# Patient Record
Sex: Female | Born: 1985 | Race: White | Hispanic: No | Marital: Married
Health system: Southern US, Community
[De-identification: ages and names within clinical notes are randomized; demographics above are authoritative.]

## PROBLEM LIST (undated history)

## (undated) DIAGNOSIS — K2100 Gastro-esophageal reflux disease with esophagitis, without bleeding: Secondary | ICD-10-CM

## (undated) DIAGNOSIS — D509 Iron deficiency anemia, unspecified: Secondary | ICD-10-CM

## (undated) DIAGNOSIS — B2790 Infectious mononucleosis, unspecified without complication: Secondary | ICD-10-CM

## (undated) DIAGNOSIS — L709 Acne, unspecified: Secondary | ICD-10-CM

## (undated) DIAGNOSIS — N816 Rectocele: Secondary | ICD-10-CM

## (undated) DIAGNOSIS — M419 Scoliosis, unspecified: Secondary | ICD-10-CM

## (undated) DIAGNOSIS — O3480 Maternal care for other abnormalities of pelvic organs, unspecified trimester: Secondary | ICD-10-CM

## (undated) DIAGNOSIS — Z789 Other specified health status: Secondary | ICD-10-CM

## (undated) HISTORY — PX: TONSILLECTOMY: SUR1361

## (undated) HISTORY — DX: Acne, unspecified: L70.9

## (undated) HISTORY — DX: Iron deficiency anemia, unspecified: D50.9

## (undated) HISTORY — DX: Rectocele: O34.80

## (undated) HISTORY — DX: Other specified health status: Z78.9

## (undated) HISTORY — DX: Rectocele: N81.6

## (undated) HISTORY — PX: MYRINGOTOMY: SUR874

## (undated) HISTORY — PX: WISDOM TOOTH EXTRACTION: SHX21

## (undated) HISTORY — DX: Scoliosis, unspecified: M41.9

## (undated) HISTORY — DX: Infectious mononucleosis, unspecified without complication: B27.90

## (undated) HISTORY — DX: Gastro-esophageal reflux disease with esophagitis, without bleeding: K21.00

---
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
## 2006-03-17 ENCOUNTER — Ambulatory Visit: Payer: Self-pay | Admitting: Family Medicine

## 2006-04-29 ENCOUNTER — Ambulatory Visit: Payer: Self-pay | Admitting: Family Medicine

## 2006-05-05 ENCOUNTER — Ambulatory Visit: Payer: Self-pay | Admitting: Family Medicine

## 2006-05-19 ENCOUNTER — Ambulatory Visit: Payer: Self-pay | Admitting: Internal Medicine

## 2006-06-09 ENCOUNTER — Ambulatory Visit: Payer: Self-pay | Admitting: Family Medicine

## 2006-06-09 ENCOUNTER — Encounter: Payer: Self-pay | Admitting: Family Medicine

## 2006-06-09 ENCOUNTER — Other Ambulatory Visit: Admission: RE | Admit: 2006-06-09 | Discharge: 2006-06-09 | Payer: Self-pay | Admitting: Family Medicine

## 2006-09-11 ENCOUNTER — Encounter: Payer: Self-pay | Admitting: Family Medicine

## 2006-09-11 DIAGNOSIS — M928 Other specified juvenile osteochondrosis: Secondary | ICD-10-CM | POA: Insufficient documentation

## 2006-09-11 DIAGNOSIS — K589 Irritable bowel syndrome without diarrhea: Secondary | ICD-10-CM

## 2006-09-11 DIAGNOSIS — M4120 Other idiopathic scoliosis, site unspecified: Secondary | ICD-10-CM | POA: Insufficient documentation

## 2007-03-03 ENCOUNTER — Ambulatory Visit: Payer: Self-pay | Admitting: Family Medicine

## 2007-06-29 ENCOUNTER — Other Ambulatory Visit: Admission: RE | Admit: 2007-06-29 | Discharge: 2007-06-29 | Payer: Self-pay | Admitting: Family Medicine

## 2007-06-29 ENCOUNTER — Ambulatory Visit: Payer: Self-pay | Admitting: Family Medicine

## 2007-06-29 ENCOUNTER — Encounter: Payer: Self-pay | Admitting: Family Medicine

## 2007-07-02 ENCOUNTER — Encounter (INDEPENDENT_AMBULATORY_CARE_PROVIDER_SITE_OTHER): Payer: Self-pay | Admitting: *Deleted

## 2007-09-22 ENCOUNTER — Ambulatory Visit: Payer: Self-pay | Admitting: Family Medicine

## 2007-09-23 ENCOUNTER — Encounter: Admission: RE | Admit: 2007-09-23 | Discharge: 2007-09-23 | Payer: Self-pay | Admitting: Family Medicine

## 2007-10-26 ENCOUNTER — Telehealth: Payer: Self-pay | Admitting: Family Medicine

## 2008-03-31 ENCOUNTER — Ambulatory Visit: Payer: Self-pay | Admitting: Internal Medicine

## 2008-06-29 ENCOUNTER — Ambulatory Visit: Payer: Self-pay | Admitting: Family Medicine

## 2008-08-14 ENCOUNTER — Encounter: Payer: Self-pay | Admitting: Family Medicine

## 2008-08-14 ENCOUNTER — Ambulatory Visit: Payer: Self-pay | Admitting: Family Medicine

## 2008-08-14 ENCOUNTER — Other Ambulatory Visit: Admission: RE | Admit: 2008-08-14 | Discharge: 2008-08-14 | Payer: Self-pay | Admitting: Family Medicine

## 2008-08-14 DIAGNOSIS — J309 Allergic rhinitis, unspecified: Secondary | ICD-10-CM | POA: Insufficient documentation

## 2008-08-18 ENCOUNTER — Encounter (INDEPENDENT_AMBULATORY_CARE_PROVIDER_SITE_OTHER): Payer: Self-pay | Admitting: *Deleted

## 2009-05-02 DIAGNOSIS — B2790 Infectious mononucleosis, unspecified without complication: Secondary | ICD-10-CM

## 2009-05-02 HISTORY — DX: Infectious mononucleosis, unspecified without complication: B27.90

## 2009-05-14 ENCOUNTER — Ambulatory Visit: Payer: Self-pay | Admitting: Family Medicine

## 2009-05-16 ENCOUNTER — Emergency Department (HOSPITAL_COMMUNITY): Admission: EM | Admit: 2009-05-16 | Discharge: 2009-05-16 | Payer: Self-pay | Admitting: Emergency Medicine

## 2009-05-17 ENCOUNTER — Emergency Department (HOSPITAL_COMMUNITY): Admission: EM | Admit: 2009-05-17 | Discharge: 2009-05-18 | Payer: Self-pay | Admitting: Emergency Medicine

## 2009-05-24 ENCOUNTER — Ambulatory Visit: Payer: Self-pay | Admitting: Family Medicine

## 2009-05-24 DIAGNOSIS — E162 Hypoglycemia, unspecified: Secondary | ICD-10-CM

## 2009-06-14 ENCOUNTER — Ambulatory Visit: Payer: Self-pay | Admitting: Family Medicine

## 2009-06-14 LAB — CONVERTED CEMR LAB
Bilirubin Urine: NEGATIVE
Blood in Urine, dipstick: NEGATIVE
Ketones, urine, test strip: NEGATIVE
Specific Gravity, Urine: >=1.03
WBC Urine, dipstick: NEGATIVE
Yeast, UA: 0

## 2009-06-20 ENCOUNTER — Telehealth: Payer: Self-pay | Admitting: Family Medicine

## 2009-07-02 ENCOUNTER — Ambulatory Visit: Payer: Self-pay | Admitting: Family Medicine

## 2009-07-03 LAB — CONVERTED CEMR LAB
Albumin: 4 g/dL (ref 3.5–5.2)
BUN: 10 mg/dL (ref 6–23)
Basophils Absolute: 0 10*3/uL (ref 0.0–0.1)
Basophils Relative: 0.8 % (ref 0.0–3.0)
CO2: 29 meq/L (ref 19–32)
Chloride: 107 meq/L (ref 96–112)
Cholesterol: 140 mg/dL (ref 0–200)
Creatinine, Ser: 0.7 mg/dL (ref 0.4–1.2)
Eosinophils Absolute: 0.1 10*3/uL (ref 0.0–0.7)
GFR calc non Af Amer: 110.17 mL/min (ref >60)
HCT: 37.7 % (ref 36.0–46.0)
LDL Cholesterol: 82 mg/dL (ref 0–99)
MCV: 87.1 fL (ref 78.0–100.0)
Monocytes Absolute: 0.4 10*3/uL (ref 0.1–1.0)
Neutro Abs: 3.3 10*3/uL (ref 1.4–7.7)
Neutrophils Relative %: 60.1 % (ref 43.0–77.0)
Potassium: 4.2 meq/L (ref 3.5–5.1)
RBC: 4.32 M/uL (ref 3.87–5.11)
RDW: 12.5 % (ref 11.5–14.6)
Total Bilirubin: 0.3 mg/dL (ref 0.3–1.2)
Total Protein: 7 g/dL (ref 6.0–8.3)
Triglycerides: 38 mg/dL (ref 0.0–149.0)
VLDL: 7.6 mg/dL (ref 0.0–40.0)

## 2009-07-09 ENCOUNTER — Encounter: Payer: Self-pay | Admitting: Family Medicine

## 2009-08-15 ENCOUNTER — Encounter: Payer: Self-pay | Admitting: Family Medicine

## 2009-09-03 ENCOUNTER — Other Ambulatory Visit: Admission: RE | Admit: 2009-09-03 | Discharge: 2009-09-03 | Payer: Self-pay | Admitting: Family Medicine

## 2009-09-03 ENCOUNTER — Ambulatory Visit: Payer: Self-pay | Admitting: Family Medicine

## 2009-09-06 ENCOUNTER — Encounter (INDEPENDENT_AMBULATORY_CARE_PROVIDER_SITE_OTHER): Payer: Self-pay | Admitting: *Deleted

## 2009-09-06 LAB — CONVERTED CEMR LAB: Pap Smear: NEGATIVE

## 2009-11-26 ENCOUNTER — Telehealth: Payer: Self-pay | Admitting: Family Medicine

## 2010-05-25 ENCOUNTER — Encounter: Payer: Self-pay | Admitting: Family Medicine

## 2010-05-28 ENCOUNTER — Ambulatory Visit: Admit: 2010-05-28 | Payer: Self-pay | Admitting: Family Medicine

## 2010-05-31 ENCOUNTER — Ambulatory Visit: Admit: 2010-05-31 | Payer: Self-pay | Admitting: Family Medicine

## 2010-06-23 ENCOUNTER — Encounter: Payer: Self-pay | Admitting: Sports Medicine

## 2010-06-25 ENCOUNTER — Encounter: Payer: Self-pay | Admitting: Family Medicine

## 2010-07-04 NOTE — Assessment & Plan Note (Signed)
Summary: PAP ONLY / LFW   Vital Signs:  Patient profile:   25 year old female Height:      64 inches Weight:      140.75 pounds BMI:     24.25 Temp:     97.9 degrees F oral Pulse rate:   76 / minute Pulse rhythm:   regular BP sitting:   102 / 70  (left arm) Cuff size:   regular  Vitals Entered By: Lewanda Rife LPN (September 03, 4096 8:34 AM) CC: pap only LMP 08/28/09   History of Present Illness: here for annual gyn exam  is feeling good   on alesse  is working out well - really good periods are not heavy or painful and short 2-3 days   no need for STD tests  wants to stay on the same pill    Allergies: 1)  ! * Microgestin Fe 2)  ! * On 777 3)  ! 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 Eyes:  Denies blurring and eye irritation. GI:  Denies abdominal pain, bloody stools, and change in bowel habits. GU:  Denies abnormal vaginal bleeding, discharge, dysuria, genital sores, and urinary frequency. MS:  Denies low back pain. Derm:  Denies itching, lesion(s), poor wound healing, and rash. Neuro:  Denies headaches. Heme:  Denies abnormal bruising and bleeding.  Physical Exam  General:  Well-developed,well-nourished,in no acute distress; alert,appropriate and cooperative throughout examination Mouth:  pharynx pink and moist.   Neck:  No deformities,  masses, or tenderness noted. Chest Wall:  No deformities, masses, or tenderness noted. Breasts:  No mass, nodules, thickening, tenderness, bulging, retraction, inflamation, nipple discharge or skin changes noted.   Heart:  Normal rate and regular rhythm. S1 and S2 normal without gallop, murmur, click, rub or other extra sounds. Abdomen:  no suprapubic tenderness or fullness felt xyphoid process palpable and nt  Genitalia:  Normal introitus for age, no external lesions, no vaginal discharge, mucosa pink and moist, no vaginal or cervical lesions, no vaginal atrophy, no friaility or hemorrhage, normal uterus size and position, no adnexal masses or tenderness Skin:  Intact without suspicious lesions or rashes Cervical Nodes:  No lymphadenopathy noted Inguinal Nodes:  No significant adenopathy Psych:  normal affect, talkative and pleasant    Impression & Recommendations:  Problem # 1:  ROUTINE GYNECOLOGICAL EXAMINATION (ICD-V72.31) Assessment Comment Only annual exam with pap - no problems with her periods on alesse no need for std screen  will renew OC   Complete Medication List: 1)  Alesse  .Marland KitchenMarland Kitchen. 1 by mouth once daily as directed 2)  Multivitamins Tabs (Multiple vitamin) .... Take 1 tablet by mouth once a day 3)  Zyrtec Allergy 10 Mg Tabs (Cetirizine hcl) .... Otc as directed.  Patient Instructions: 1)  keep up good health habits  2)  no change in oral contraceptive  Prescriptions: ALESSE 1 by mouth once daily as directed  #1 pack  x 11   Entered and Authorized by:   Judith Part MD   Signed by:   Judith Part MD on 09/03/2009   Method used:   Print then Give to Patient   RxID:   [id number]   Current Allergies (reviewed today): ! * MICROGESTIN FE ! * ON 777 ! Fatima Blank

## 2010-07-04 NOTE — Letter (Signed)
Summary: [facility]  [facility]   Imported By: Lanelle Bal 06/07/2010 12:59:04  _____________________________________________________________________  External Attachment:    Type:   Image     Comment:   External Document

## 2010-07-04 NOTE — Progress Notes (Signed)
Summary: refill  Phone Note Refill Request Call back at Home Phone [phone] Message from:  Scriptline on November 26, 2009 9:40 AM  Refills Requested: Medication #1:  ALESSE 1 by mouth once daily as directed  3 month supply to [facility] [address]   Method Requested: Electronic Initial call taken by: Benny Lennert CMA Duncan Dull),  November 26, 2009 9:41 AM  Follow-up for Phone Call        px written on EMR for call in  Follow-up by: Judith Part MD,  November 26, 2009 10:11 AM  Additional Follow-up for Phone Call Additional follow up Details #1::        Medication phoned to pharmacy.  Additional Follow-up by: Delilah Shan CMA Duncan Dull),  November 26, 2009 12:23 PM    Prescriptions: ALESSE 1 by mouth once daily as directed  #3 packs x 3   Entered by:   Delilah Shan CMA (AAMA)   Authorized by:   Judith Part MD   Signed by:   Delilah Shan CMA (AAMA) on 11/26/2009   Method used:   Handwritten   RxID:   [id number]

## 2010-07-04 NOTE — Consult Note (Signed)
Summary: [facility]  [facility]   Imported By: Lanelle Bal 07/12/2009 10:38:03  _____________________________________________________________________  External Attachment:    Type:   Image     Comment:   External Document

## 2010-07-04 NOTE — Assessment & Plan Note (Signed)
Summary: CPX  W/PAP / LFW   Vital Signs:  Patient profile:   25 year old female Height:      64 inches Weight:      141 pounds BMI:     24.29 Temp:     97.9 degrees F oral Pulse rate:   76 / minute Pulse rhythm:   regular BP sitting:   110 / 68  (left arm) Cuff size:   regular  Vitals Entered By: Lowella Petties CMA (July 02, 2009 10:36 AM) CC: 30 minute check up   History of Present Illness: here for health mt exam is doing well overall   wt is down 2 lb with bmi 24-- good   bp great 110/68  pap 3/10-- is a little early for that- will return   Td 04 up to date  has not had flu shot - has never gotten one   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 * Microgestin Fe 2)  ! * On 777 3)  ! 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 Eyes:  Denies blurring and eye irritation. ENT:  Complains of  postnasal drainage; denies sinus pressure and sore throat. CV:  Denies chest pain or discomfort and palpitations. Resp:  Complains of cough and sputum productive; denies pleuritic, shortness of breath, and wheezing. GI:  Denies abdominal pain, change in bowel habits, and nausea. GU:  Denies abnormal vaginal bleeding, discharge, and dysuria. MS:  Denies joint pain. Derm:  Denies lesion(s), poor wound healing, and rash. Neuro:  Denies numbness and tingling. Psych:  Denies anxiety and depression. Endo:  Denies cold intolerance, excessive thirst, excessive urination, and heat intolerance. Heme:  Denies abnormal bruising and bleeding.  Physical Exam  General:  Well-developed,well-nourished,in no acute distress; alert,appropriate and cooperative throughout examination Head:  normocephalic, atraumatic, and no abnormalities observed.   Eyes:  vision grossly intact, pupils equal, pupils round, and pupils reactive to light.  no conjunctival pallor, injection or icterus  Ears:  R ear normal and L ear normal.   Nose:  no nasal discharge.   Mouth:  pharynx pink and moist.   Neck:  supple with full rom and no masses or thyromegally, no JVD or carotid bruit  Chest Wall:  No deformities, masses, or tenderness noted. Lungs:  Normal respiratory effort, chest expands symmetrically. Lungs are clear to auscultation, no crackles or wheezes. Heart:  Normal rate and  regular rhythm. S1 and S2 normal without gallop, murmur, click, rub or other extra sounds. Abdomen:  Bowel sounds positive,abdomen soft and non-tender without masses, organomegaly or hernias noted. Msk:  baseline scoliosis noted no acute joint changes  Pulses:  R and L carotid,radial,femoral,dorsalis pedis and posterior tibial pulses are full and equal bilaterally Extremities:  No clubbing, cyanosis, edema, or deformity noted with normal full range of motion of all joints.   Neurologic:  sensation intact to light touch, gait normal, and DTRs  symmetrical and normal.   Skin:  Intact without suspicious lesions or rashes few lentigos/ fair complexion Cervical Nodes:  No lymphadenopathy noted Inguinal Nodes:  No significant adenopathy Psych:  normal affect, talkative and pleasant    Impression & Recommendations:  Problem # 1:  HEALTH MAINTENANCE EXAM (ICD-V70.0) Assessment Comment Only reviewed health habits including diet, exercise and skin cancer prevention reviewed health maintenance list and family history lab today will f/u for pap/gyn exam in april when due-- will rev labs then  Orders: Venipuncture ([address]) TLB-Lipid Panel (80061-LIPID) TLB-BMP (Basic Metabolic Panel-BMET) (80048-METABOL) TLB-CBC Platelet - w/Differential (85025-CBCD) TLB-Hepatic/Liver Function Pnl (80076-HEPATIC) TLB-TSH (Thyroid Stimulating Hormone) (84443-TSH)  Complete Medication List: 1)  Alesse  .Marland KitchenMarland Kitchen. 1 by mouth once daily as directed 2)  Multivitamins Tabs (Multiple vitamin) .... Take 1 tablet by mouth once a day  Patient Instructions: 1)  labs today 2)  keep up the good diet and exercise and sleep habits  3)  schedule f/u in april for pap only (15 min visit is fine)  Prior Medications (reviewed today): ALESSE () 1 by mouth once daily as directed MULTIVITAMINS   TABS (MULTIPLE VITAMIN) Take 1 tablet by mouth once a day Current Allergies: ! * MICROGESTIN FE ! * ON 777 ! Lindsey Clements

## 2010-07-04 NOTE — Letter (Signed)
Summary: Results Follow up Letter  [place] at [facility]  [address] [place], [place] [address]   Phone: [phone]  Fax: [phone]    09/06/2009 MRN: [id number]    [facility] [address] [facility] [address] [place], [place]  [address]    Dear Ms. BURROUGHS,  The following are the results of your recent test(s):  Test         Result    Pap Smear:        Normal __X___  Not Normal _____ Comments:  Yearly follow up is recommended. ______________________________________________________ Cholesterol: LDL(Bad cholesterol):         Your goal is less than:         HDL (Good cholesterol):       Your goal is more than: Comments:  ______________________________________________________ Mammogram:        Normal _____  Not Normal _____ Comments:  ___________________________________________________________________ Hemoccult:        Normal _____  Not normal _______ Comments:    _____________________________________________________________________ Other Tests:    We routinely do not discuss normal results over the telephone.  If you desire a copy of the results, or you have any questions about this information we can discuss them at your next office visit.   Sincerely,    Marne A. Milinda Antis, M.D.  MAT:lsf

## 2010-07-04 NOTE — Progress Notes (Signed)
  Phone Note Call from Patient   Caller: Patient Summary of Call: Patient would like to be referred to [facility] ENT any recommendations as to who she should see? would like them to look at her throat, she is still coughing up like mucous and wants to see a specialist. The best day for her is a Monday but will take any appt. Call her [phone] Initial call taken by: Carlton Adam,  June 20, 2009 11:06 AM  Follow-up for Phone Call        ref to ent for sore throat and congestion I have no pref at [facility] -- all are fine  will route to [facility] Follow-up by: Judith Part MD,  June 20, 2009 11:08 AM  Additional Follow-up for Phone Call Additional follow up Details #1::        Appt made with Dr Jenne Pane at [facility] on 07/09/2009 at 2:15pm. Carlton Adam  June 20, 2009 2:51 PM  Additional Follow-up by: Carlton Adam,  June 20, 2009 2:51 PM  New Problems: SORE THROAT (ICD-462)   New Problems: SORE THROAT (ICD-462)

## 2010-07-04 NOTE — Assessment & Plan Note (Signed)
Summary: ?UTI/MK   Vital Signs:  Patient profile:   25 year old female Weight:      143 pounds Temp:     97.9 degrees F oral Pulse rate:   76 / minute Pulse rhythm:   regular BP sitting:   110 / 60  (left arm) Cuff size:   regular  Vitals Entered By: Lowella Petties CMA (June 14, 2009 10:41 AM) CC: Some burning with urination, itching.   History of Present Illness: some urinary symptoms since sunday at church  urinated a little -- and urge kept coming without much volume  next day some burning with urination -- now more itchy than anything else   urine smells funny   no fever or back pain  no nausea   has been drinking cranberry juice -- that helps a little   very uncomfortable now today a little itchy   nl vag d/c for her without changes   is not on menses no chance pregnant  Allergies: 1)  ! * Microgestin Fe 2)  ! * On 777 3)  ! 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 CV:  Denies chest pain or discomfort and palpitations. Resp:  Denies cough. GI:  Denies change in bowel habits, nausea, and vomiting. GU:  Complains of dysuria, nocturia, and urinary frequency. Derm:  Denies rash. Endo:  Denies excessive thirst.  Physical Exam  General:  Well-developed,well-nourished,in no acute distress; alert,appropriate and  cooperative throughout examination Head:  normocephalic, atraumatic, and no abnormalities observed.   Neck:  No deformities, masses, or tenderness noted. Lungs:  Normal respiratory effort, chest expands symmetrically. Lungs are clear to auscultation, no crackles or wheezes. Heart:  Normal rate and regular rhythm. S1 and S2 normal without gallop, murmur, click, rub or other extra sounds. Abdomen:  no suprapubic tenderness or fullness felt   Msk:  no CVA tenderness  Skin:  Intact without suspicious lesions or rashes Cervical Nodes:  No lymphadenopathy noted Inguinal Nodes:  No significant adenopathy Psych:  normal affect, talkative and pleasant    Impression & Recommendations:  Problem # 1:  UTI (ICD-599.0) Assessment New  with dysuria and frequency  ua neg dip but pos micro will tx with 5 d of cipro and update  enc water intake  pt advised to update me if symptoms worsen or do not improve - esp if any fever or back pain Her updated medication list for this problem includes:    Cipro 250 Mg Tabs (Ciprofloxacin hcl) .Marland Kitchen... 1 by mouth two times a day for 5 days  Orders: UA Dipstick W/ Micro (manual) ([address])  Complete Medication List: 1)  Alesse  .Marland KitchenMarland Kitchen. 1 by mouth once daily as directed 2)  Multivitamins Tabs (Multiple vitamin) .... Take 1 tablet by mouth once a day 3)  Cipro 250 Mg Tabs (Ciprofloxacin hcl) .Marland Kitchen.. 1 by mouth two times a day for 5 days  Patient Instructions: 1)  continue  drinking lots of water 2)  call or seek care is symptoms don't improve in 2-3 days or if you develop back pain, nausea, or vomiting  3)  take cipro as directed  Prescriptions: CIPRO 250 MG TABS (CIPROFLOXACIN HCL) 1 by mouth two times a day for 5 days  #10 x 0   Entered and Authorized by:   Judith Part MD   Signed by:   Judith Part MD on 06/14/2009   Method used:   Print then Give to Patient   RxID:   [id number]   Laboratory Results   Urine Tests  Date/Time Received: June 14, 2009 10:37 AM  Date/Time Reported: June 14, 2009 10:37 AM   Routine Urinalysis   Color: yellow Appearance: Clear Glucose: negative   (Normal Range: Negative) Bilirubin: negative   (Normal Range: Negative) Ketone: negative   (Normal Range: Negative) Spec. Gravity: >=1.030   (Normal Range: 1.003-1.035) Blood: negative   (Normal Range: Negative) pH: 5.0   (Normal Range: 5.0-8.0) Protein: negative   (Normal Range: Negative) Urobilinogen: 0.2   (Normal Range: 0-1) Nitrite: negative   (Normal Range: Negative) Leukocyte Esterace: negative   (Normal Range: Negative)  Urine Microscopic WBC/HPF: 5-8 RBC/HPF: 2-3 Bacteria/HPF: many Mucous/HPF: few Epithelial/HPF: 1-2 Crystals/HPF: 0 Casts/LPF: 0 Yeast/HPF: 0 Other: 0        Prior Medications (reviewed today): ALESSE () 1 by mouth once daily as directed MULTIVITAMINS   TABS (MULTIPLE VITAMIN) Take 1 tablet by mouth once a day CIPRO 250 MG TABS (CIPROFLOXACIN HCL) 1 by mouth two times a day for 5 days Current Allergies: ! * MICROGESTIN FE ! * ON 777 ! Lindsey Clements

## 2010-07-04 NOTE — Consult Note (Signed)
Summary: [facility]  [facility]   Imported By: Lanelle Bal 08/20/2009 10:53:20  _____________________________________________________________________  External Attachment:    Type:   Image     Comment:   External Document

## 2010-07-10 NOTE — Consult Note (Signed)
Summary: [facility]  [facility]   Imported By: Sherian Rein 07/03/2010 08:50:49  _____________________________________________________________________  External Attachment:    Type:   Image     Comment:   External Document

## 2010-07-11 ENCOUNTER — Encounter: Payer: Self-pay | Admitting: Family Medicine

## 2010-07-11 ENCOUNTER — Ambulatory Visit (INDEPENDENT_AMBULATORY_CARE_PROVIDER_SITE_OTHER): Payer: BC Managed Care – PPO | Admitting: Family Medicine

## 2010-07-11 DIAGNOSIS — J019 Acute sinusitis, unspecified: Secondary | ICD-10-CM | POA: Insufficient documentation

## 2010-07-11 DIAGNOSIS — J309 Allergic rhinitis, unspecified: Secondary | ICD-10-CM

## 2010-07-18 NOTE — Assessment & Plan Note (Signed)
Summary: NASAL CONGESTION/MK   Vital Signs:  Patient profile:   25 year old female Height:      64 inches Weight:      144.50 pounds BMI:     24.89 Temp:     98.7 degrees F oral Pulse rate:   76 / minute Pulse rhythm:   regular BP sitting:   92 / 60  (left arm) Cuff size:   regular  Vitals Entered By: Benny Lennert CMA Duncan Dull) (July 11, 2010 10:35 AM)  History of Present Illness: Chief complaint nasal congestion  25 year ld female with history of mono 1 year ago. Since then feels lump in throat and post nasal drip.  In winter .Marland Kitchen noted more drainage and sore throat....treated with amoxicillin and steroid shot. Felt better.. no further ST pain, but continued post nasal drip.  1/24.Marland Kitchen amoxicllin x 10 days.Marland Kitchenstarted by urgent care...made slight difference  Continued to have issues so went to her ENT  2 weeks ago went to ENT... told thought GERD causing mucus. Started on omprazole 40 mg daily..no help at all.  Per pt he did not do laryngoscopy.  Now in last week nasal congestion.. blowing out green discharge. Taking decongestant...using afrin daily x 1 day. No ear pain, mld soreness in neck No face pain, no fever.   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 1 By Mouth Once Daily As Directed 2)  Multivitamins   Tabs (Multiple Vitamin) .... Take 1 Tablet By Mouth Once A Day 3)  Zyrtec Allergy 10 Mg Tabs (Cetirizine Hcl) .... Otc As Directed.  Allergies: 1)  ! * Microgestin Fe 2)  ! * On 777 3)  ! * Yasmin  Past History:  Past medical, surgical, family and social histories (including risk factors) reviewed, and no changes noted (except as noted  below).  Past Medical History: Reviewed history from 05/24/2009 and no changes required. scoliosis mild acne mononucleosis 12/10  Past Surgical History: Reviewed history from 09/11/2006 and no changes required. Tonsillectomy Myringotomy tubes  Family History: Reviewed history from 05/24/2009 and no changes required. mother- thyroid problems and HTN  GF with alzheimer's GF with HTN and cholesterol GM with HTN  GM - DM   Social History: Reviewed history from 06/29/2008 and no changes required. 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 CV:  Denies chest pain or discomfort. Resp:  Denies shortness of breath. GI:  Denies abdominal pain. GU:  Denies dysuria.  Physical Exam  General:  Well-developed,well-nourished,in no acute distress; alert,appropriate and cooperative throughout examination Head:  ttp B ethmoid sinuses, no maxillary ttp  Ears:  clear fluid B TMs  Nose:  nasal dischargemucosal pallor.   Mouth:  oropharyngeal erythema mild, no exudate Neck:  B ant cervixal nodes tender Lungs:  Normal respiratory effort, chest expands symmetrically. Lungs are clear to auscultation, no crackles or wheezes. Heart:  Normal rate and regular rhythm. S1 and S2 normal without gallop, murmur, click, rub or other extra sounds. Pulses:  R and L carotid,radial,femoral,dorsalis pedis and posterior tibial pulses are full and equal bilaterally Skin:  Intact without  suspicious lesions or rashes   Impression & Recommendations:  Problem # 1:  SINUSITIS, ACUTE (ICD-461.9) Assessment New NAsal saline irrigation, broaden antibiotics given .. symptoms > 3 weeks.  Her updated medication list for this problem includes:    Amoxicillin-pot Clavulanate 400-57 Mg/90ml Susr (Amoxicillin-pot clavulanate) .Marland Kitchen... 2 tsp by mouth two times a day x 10 days    Fluticasone Propionate 50 Mcg/act  Susp (Fluticasone propionate) .Marland Kitchen... 2 sprays per nostril daily  Instructed on treatment. Call if symptoms persist or worsen.   Problem # 2:  ALLERGIC RHINITIS (ICD-477.9) Assessment: New Likely cause of chronic issues. Start nasal steroid and antihistamine.  Stop omeprazole given no improvement.  Her updated medication list for this problem includes:    Zyrtec Allergy 10 Mg Tabs (Cetirizine hcl) ..... Otc as directed.    Fluticasone Propionate 50 Mcg/act Susp (Fluticasone propionate) .Marland Kitchen... 2 sprays per nostril daily  Complete Medication List: 1)  Alesse  .Marland KitchenMarland Kitchen. 1 by mouth once daily as directed 2)  Multivitamins Tabs (Multiple vitamin) .... Take 1 tablet by mouth once a day 3)  Zyrtec Allergy 10 Mg Tabs (Cetirizine hcl) .... Otc as directed. 4)  Amoxicillin-pot Clavulanate 400-57 Mg/33ml Susr (Amoxicillin-pot clavulanate) .... 2 tsp by mouth two times a day x 10 days 5)  Fluticasone Propionate 50 Mcg/act Susp (Fluticasone propionate) .... 2 sprays per nostril daily  Patient Instructions: 1)  Take your antibiotic as prescribed until ALL of it is gone, but stop if you develop a rash or swelling and contact our office as soon as possible.  2)  Start nasal steroid, nasal saline spray as well. 3)  Start zyrtec at bedtime.  4)  Cal if not improving as expected.  Prescriptions: FLUTICASONE PROPIONATE 50 MCG/ACT SUSP (FLUTICASONE PROPIONATE) 2 sprays per nostril daily  #1 x 1   Entered and Authorized by:   Kerby Nora MD   Signed by:   Kerby Nora MD on 07/11/2010   Method used:   Electronically to        [facility] [id number] (retail[facility]       [address]       [place], [place]  [address]       Ph: [phone] or [phone]       Fax: [phone]   RxID:   [id number] AMOXICILLIN-POT CLAVULANATE 400-57 MG/5ML SUSR (AMOXICILLIN-POT CLAVULANATE) 2 tsp by mouth two times a day x 10 days  #200 x 0   Entered and Authorized by:   Kerby Nora MD   Signed by:   Kerby Nora MD on  07/11/2010   Method used:   [facility]        [facility] [id number]       [address]       [place], [place]  [address]       Ph: [phone] or [phone]       Fax: [phone]   RxID:   [id number]    Orders Added: 1)  Est. Patient Level IV [42706]    Current Allergies (reviewed today): ! * MICROGESTIN FE ! * ON 777 ! Fatima Blank

## 2010-09-02 LAB — CBC
MCHC: 34.6 g/dL (ref 30.0–36.0)
Platelets: 251 10*3/uL (ref 150–400)
RBC: 4.57 MIL/uL (ref 3.87–5.11)
RDW: 13.9 % (ref 11.5–15.5)

## 2010-09-02 LAB — DIFFERENTIAL
Basophils Relative: 1 % (ref 0–1)
Eosinophils Absolute: 0.1 10*3/uL (ref 0.0–0.7)

## 2010-09-02 LAB — MONONUCLEOSIS SCREEN: Mono Screen: POSITIVE — AB

## 2010-10-18 NOTE — Assessment & Plan Note (Signed)
[facility]                         GASTROENTEROLOGY OFFICE NOTE   Lindsey, Clements                   MRN:          [id number]  DATE:05/19/2006                            DOB:          13-Jun-1985    REFERRING PHYSICIAN:  Marne A. Tower, MD   REASON FOR CONSULTATION:  Constipation   ASSESSMENT:  Constipation.  Probably related to irritable bowel  problems.  CBC, CMET, TSH are normal.   RECOMMENDATIONS AND PLAN:  She has tried some MiraLax, but I do not  think she gave it a fair shot.  I will have her increase fiber, try a  fiber supplement, and take 4 doses of MiraLax in an hour to an hour and  a half one night, and repeat that to try to promote emptying of her  bowels, if needed.  After that, she should take MiraLax once a day.  Should she have persistent problems with that regimen, I can see her  back and we can consider flexible sigmoidoscopy, plus or minus barium  enema.  There are no other worrisome features at this time.  She has  changed  her diet and activity level over the past 6 months or so, and  that may have something to do with this.   HISTORY:  A pleasant 25 year old white woman who is in  cosmetology/beautician school.  She actually just finished that.  She  has also been working at [employer] and been eating a lot of fast food.  Sometime over the past several months, she has noticed that she goes a  week or a week and a half without significant defecation and gradually  gets more bloated and distended.  Then she empties her bowels multiple  times over 45 minutes or so.  There is no bleeding.  There is no rectal  pain.  There is no abdominal pain.  She saw Dr. Milinda Antis and was told to  increase fiber.  She tried MiraLax for about 4 days on the daily dose,  and seems to think it provided a little bit of relief, but nothing  significant she says.  She had a CBC, CMET, and TSH that were normal.   MEDICATIONS:  Birth control  pills once a day.   DRUG ALLERGIES:  NONE KNOWN.   PAST MEDICAL HISTORY:  Depression.   FAMILY HISTORY:  Diabetes in a grandmother.  No GI history.   SOCIAL HISTORY:  She lives with her grandparents.  She has just  graduated from beauty school, and was about to look for a job.  She is  single.  No alcohol, tobacco, or drugs.   REVIEW OF SYSTEMS:  See medical history form.   PHYSICAL EXAMINATION:  Physical exam reveals a well-developed young  white woman, height 5 feet 4 inches.  Weight 128 pounds.  Blood pressure  106/62.  Pulse 80 and regular.  She is in no acute distress.  The eyes are anicteric.  Heart S1, S2, no murmurs, rubs or gallops.  ABDOMEN:  Soft, nontender, no organomegaly or mass.  RECTAL EXAM:  In the presence of Lindsey Reading, RN, shows no  mass.  There  is some firm stool in the vault.  There is normal descent, normal tone,  no perianal changes.  SKIN:  No rash.  PSYCH:  She is alert and oriented times 3..   I appreciate the opportunity to care for this patient.     Iva Boop, MD,FACG  Electronically Signed    CEG/MedQ  DD: 05/19/2006  DT: 05/19/2006  Job #: [id number]   cc:   Marne A. Milinda Antis, MD

## 2010-11-19 ENCOUNTER — Encounter: Payer: Self-pay | Admitting: Family Medicine

## 2010-11-20 ENCOUNTER — Ambulatory Visit (INDEPENDENT_AMBULATORY_CARE_PROVIDER_SITE_OTHER): Payer: BC Managed Care – PPO | Admitting: Family Medicine

## 2010-11-20 ENCOUNTER — Encounter: Payer: Self-pay | Admitting: Family Medicine

## 2010-11-20 VITALS — BP 100/68 | HR 72 | Temp 97.6°F | Ht 64.0 in | Wt 134.8 lb

## 2010-11-20 DIAGNOSIS — N943 Premenstrual tension syndrome: Secondary | ICD-10-CM

## 2010-11-20 DIAGNOSIS — F3281 Premenstrual dysphoric disorder: Secondary | ICD-10-CM | POA: Insufficient documentation

## 2010-11-20 MED ORDER — DROSPIRENONE-ETHINYL ESTRADIOL 3-0.02 MG PO TABS: 1.0000 | ORAL_TABLET | Freq: Every day | ORAL | Status: DC

## 2010-11-20 NOTE — Patient Instructions (Signed)
Start yaz after this peroid  If any problems let me know  Don't smoke on the pill Schedule PE/ PAP this summer-any 30 min visit is fine

## 2010-11-20 NOTE — Progress Notes (Signed)
  Subjective:    Patient ID: Lindsey Clements, female    DOB: November 24, 1985, 24 y.o.   MRN: [id number]  HPI Has been on lessina for more than a year  Then stopped taking it for about 6 months (was not sexually active) Started back 2 mo prior to getting married  Notices worse PMS and worse headaches right before menses  Has 5-7 days of PMS - and then as soon as peroid starts is good / no problems   Has been on loestrin before -- got light headed   Got married - it all going well   Also sex drive is down as well  Stressed and very busy   Patient Active Problem List  Diagnoses  . HYPOGLYCEMIA, UNSPECIFIED  . IRRITABLE BOWEL SYNDROME  . OSGOOD SCHLATTER'S DISEASE  . SCOLIOSIS  . SINUSITIS, ACUTE  . ALLERGIC RHINITIS  . PMDD (premenstrual dysphoric disorder)   Past Medical History  Diagnosis Date  . Scoliosis   . Mild acne   . Mononucleosis 12/10   Past Surgical History  Procedure Date  . Tonsillectomy   . Myringotomy    History  Substance Use Topics  . Smoking status: Never Smoker   . Smokeless tobacco: Never Used  . Alcohol Use: No   Family History  Problem Relation Age of Onset  . Hypertension Mother   . Thyroid disease Mother   . Miscarriages / Stillbirths Neg Hx    No Known Allergies Current Outpatient Prescriptions on File Prior to Visit  Medication Sig Dispense Refill  . Multiple Vitamin (MULTIVITAMIN) tablet Take 1 tablet by mouth daily.        . cetirizine (ZYRTEC) 10 MG tablet Take OTC as directed as needed      . fluticasone (FLONASE) 50 MCG/ACT nasal spray 2 sprays daily as needed.            Review of Systems Review of Systems  Constitutional: Negative for fever, appetite change, and unexpected weight change. Pos for fatigue  Eyes: Negative for pain and visual disturbance.  Respiratory: Negative for cough and shortness of breath.   Cardiovascular: Negative.  for cp or palpitations  Gastrointestinal: Negative for nausea, diarrhea and  constipation.  Genitourinary: Negative for urgency and frequency.  Skin: Negative for pallor.  Neurological: Negative for weakness, light-headedness, numbness and headaches.  Hematological: Negative for adenopathy. Does not bruise/bleed easily.  Psychiatric/Behavioral: neg for SI , pos for moodiness with menses         Objective:   Physical Exam  Constitutional: She appears well-developed and well-nourished. No distress.  HENT:  Head: Normocephalic and atraumatic.  Mouth/Throat: Oropharynx is clear and moist.  Eyes: Conjunctivae are normal. Pupils are equal, round, and reactive to light.  Neck: Normal range of motion. Neck supple. No thyromegaly present.  Cardiovascular: Normal rate, regular rhythm, normal heart sounds and intact distal pulses.   Pulmonary/Chest: Effort normal and breath sounds normal. No respiratory distress. She has no wheezes.  Abdominal: Soft. Bowel sounds are normal.       No suprapubic tenderness    Musculoskeletal: She exhibits no edema.  Neurological: She is alert. She has normal reflexes.  Skin: Skin is warm and dry. No rash noted. No erythema. No pallor.  Psychiatric: She has a normal mood and affect. Judgment normal.          Assessment & Plan:

## 2010-11-20 NOTE — Assessment & Plan Note (Signed)
Including mood changes/ headaches/ malaise/ dec libido Disc hormonal eff on all these symptoms  Will change pill to yaz and update (warned not to smoke- which she does not) Urged to keep up healthy diet and exercise

## 2010-12-08 ENCOUNTER — Telehealth: Payer: Self-pay | Admitting: Family Medicine

## 2010-12-08 DIAGNOSIS — Z0000 Encounter for general adult medical examination without abnormal findings: Secondary | ICD-10-CM | POA: Insufficient documentation

## 2010-12-08 NOTE — Telephone Encounter (Signed)
Message copied by Judy Pimple on Sun Dec 08, 2010 12:15 PM ------      Message from: Baldomero Lamy      Created: Thu Dec 05, 2010 10:18 AM      Regarding: Cpx labs mon       Please order  future cpx labs for pt's upcomming lab appt.      Thanks      Rodney Booze

## 2010-12-09 ENCOUNTER — Other Ambulatory Visit: Payer: BC Managed Care – PPO

## 2010-12-10 ENCOUNTER — Other Ambulatory Visit (INDEPENDENT_AMBULATORY_CARE_PROVIDER_SITE_OTHER): Payer: BC Managed Care – PPO | Admitting: Family Medicine

## 2010-12-10 DIAGNOSIS — Z0000 Encounter for general adult medical examination without abnormal findings: Secondary | ICD-10-CM

## 2010-12-10 LAB — CBC WITH DIFFERENTIAL/PLATELET
Eosinophils Relative: 2.1 % (ref 0.0–5.0)
HCT: 37.5 % (ref 36.0–46.0)
Hemoglobin: 12.9 g/dL (ref 12.0–15.0)
MCHC: 34.5 g/dL (ref 30.0–36.0)
Monocytes Absolute: 0.2 10*3/uL (ref 0.1–1.0)
Monocytes Relative: 4.1 % (ref 3.0–12.0)
Neutrophils Relative %: 52.8 % (ref 43.0–77.0)
Platelets: 212 10*3/uL (ref 150.0–400.0)
RBC: 4.33 Mil/uL (ref 3.87–5.11)
WBC: 5.8 10*3/uL (ref 4.5–10.5)

## 2010-12-10 LAB — COMPREHENSIVE METABOLIC PANEL
ALT: 18 U/L (ref 0–35)
AST: 21 U/L (ref 0–37)
Albumin: 4.5 g/dL (ref 3.5–5.2)
Alkaline Phosphatase: 34 U/L — ABNORMAL LOW (ref 39–117)
BUN: 14 mg/dL (ref 6–23)
CO2: 26 mEq/L (ref 19–32)
Calcium: 9.3 mg/dL (ref 8.4–10.5)
GFR: 116.47 mL/min (ref >60.00)
Total Bilirubin: 0.6 mg/dL (ref 0.3–1.2)
Total Protein: 7.1 g/dL (ref 6.0–8.3)

## 2010-12-10 LAB — LIPID PANEL: Triglycerides: 124 mg/dL (ref 0.0–149.0)

## 2010-12-16 ENCOUNTER — Other Ambulatory Visit (HOSPITAL_COMMUNITY)
Admission: RE | Admit: 2010-12-16 | Discharge: 2010-12-16 | Disposition: A | Discharge status: Home / Self Care | Payer: BC Managed Care – PPO | Source: Ambulatory Visit | Attending: Family Medicine | Admitting: Family Medicine

## 2010-12-16 ENCOUNTER — Encounter: Payer: Self-pay | Admitting: Family Medicine

## 2010-12-16 ENCOUNTER — Ambulatory Visit (INDEPENDENT_AMBULATORY_CARE_PROVIDER_SITE_OTHER): Payer: BC Managed Care – PPO | Admitting: Family Medicine

## 2010-12-16 DIAGNOSIS — F3281 Premenstrual dysphoric disorder: Secondary | ICD-10-CM

## 2010-12-16 DIAGNOSIS — Z01419 Encounter for gynecological examination (general) (routine) without abnormal findings: Secondary | ICD-10-CM | POA: Insufficient documentation

## 2010-12-16 DIAGNOSIS — Z0000 Encounter for general adult medical examination without abnormal findings: Secondary | ICD-10-CM

## 2010-12-16 DIAGNOSIS — N943 Premenstrual tension syndrome: Secondary | ICD-10-CM

## 2010-12-16 DIAGNOSIS — G43909 Migraine, unspecified, not intractable, without status migrainosus: Secondary | ICD-10-CM | POA: Insufficient documentation

## 2010-12-16 NOTE — Assessment & Plan Note (Signed)
Annual exam with pap done  On yaz

## 2010-12-16 NOTE — Assessment & Plan Note (Addendum)
On yaz- has not noticed difference yet but just started it recently and will give it more time  No problems

## 2010-12-16 NOTE — Progress Notes (Signed)
Subjective:    Patient ID: Lindsey Clements, female    DOB: [birth year], 25 y.o.   MRN: [id number]  HPI Here for health mt exam and gyn care   Last visit changed OC from lessina to yaz for side eff and PMDD and low libido (prev loestrin made her dizzy) Is doing fairly well with new OC so far   Had a headache last night - comes and goes- waiting to see if this improves with yaz also  In general feels good   Pap 4/11 nl No gyn issues  Married now  Td 04  Wellness labs all nl  Good chol Lab Results  Component Value Date   CHOL 141 12/10/2010   CHOL 140 07/02/2009   Lab Results  Component Value Date   HDL 49.80 12/10/2010   HDL 19.14 07/02/2009   Lab Results  Component Value Date   LDLCALC 66 12/10/2010   LDLCALC 82 07/02/2009   Lab Results  Component Value Date   TRIG 124.0 12/10/2010   TRIG 38.0 07/02/2009   Lab Results  Component Value Date   CHOLHDL 3 12/10/2010   CHOLHDL 3 07/02/2009   No results found for this basename: LDLDIRECT    Patient Active Problem List  Diagnoses  . HYPOGLYCEMIA, UNSPECIFIED  . IRRITABLE BOWEL SYNDROME  . OSGOOD SCHLATTER'S DISEASE  . SCOLIOSIS  . ALLERGIC RHINITIS  . PMDD (premenstrual dysphoric disorder)  . Routine general medical examination at a health care facility  . Gynecological examination  . Migraine   Past Medical History  Diagnosis Date  . Scoliosis   . Mild acne   . Mononucleosis 12/10   Past Surgical History  Procedure Date  . Tonsillectomy   . Myringotomy    History  Substance Use Topics  . Smoking status: Never Smoker   . Smokeless tobacco: Never Used  . Alcohol Use: No   Family History  Problem Relation Age of Onset  . Hypertension Mother   . Thyroid disease Mother   . Miscarriages / Stillbirths Neg Hx    No Known Allergies Current Outpatient Prescriptions on File Prior to Visit  Medication Sig Dispense Refill  . drospirenone-ethinyl estradiol (YAZ) 3-0.02 MG per tablet Take 1 tablet by mouth  daily.  28 tablet  11  . Multiple Vitamin (MULTIVITAMIN) tablet Take 1 tablet by mouth daily.        . cetirizine (ZYRTEC) 10 MG tablet Take OTC as directed as needed      . fluticasone (FLONASE) 50 MCG/ACT nasal spray 2 sprays daily as needed.           Review of Systems Review of Systems  Constitutional: Negative for fever, appetite change, fatigue and unexpected weight change.  Eyes: Negative for pain and visual disturbance.  Respiratory: Negative for cough and shortness of breath.   Cardiovascular: Negative for cp or palpitations.   Gastrointestinal: Negative for nausea, diarrhea and constipation.  Genitourinary: Negative for urgency and frequency.  Skin: Negative for pallor.  Neurological: Negative for weakness, light-headedness, numbness and pos for occas headaches Hematological: Negative for adenopathy. Does not bruise/bleed easily.  Psychiatric/Behavioral: Negative for dysphoric mood. The patient is not nervous/anxious.          Objective:   Physical Exam  Constitutional: She appears well-developed and well-nourished. No distress.  HENT:  Head: Normocephalic and atraumatic.  Right Ear: External ear normal.  Left Ear: External ear normal.  Nose: Nose normal.  Mouth/Throat: Oropharynx is clear and moist.  Eyes: Conjunctivae  and EOM are normal. Pupils are equal, round, and reactive to light.  Neck: Normal range of motion. Neck supple. No JVD present. No thyromegaly present.  Cardiovascular: Normal rate, regular rhythm, normal heart sounds and intact distal pulses.   No murmur heard. Pulmonary/Chest: Effort normal and breath sounds normal. No respiratory distress. She has no wheezes.  Abdominal: Soft. Bowel sounds are normal. She exhibits no distension and no mass. There is no tenderness.  Genitourinary: Vagina normal and uterus normal. No breast swelling, tenderness, discharge or bleeding. No vaginal discharge found.  Musculoskeletal: Normal range of motion. She exhibits  no edema and no tenderness.  Lymphadenopathy:    She has no cervical adenopathy.  Neurological: She is alert. She has normal reflexes. No cranial nerve deficit. Coordination normal.  Skin: Skin is warm and dry. No rash noted. No erythema. No pallor.       3 mm flat light brown nevus near L ear - nl shape and color == appears b9  Psychiatric: She has a normal mood and affect.          Assessment & Plan:

## 2010-12-16 NOTE — Assessment & Plan Note (Signed)
Reviewed health habits including diet and exercise and skin cancer prevention Also reviewed health mt list, fam hx and immunizations   Reviewed wellness labs with pt in detail  Good health habits

## 2010-12-16 NOTE — Assessment & Plan Note (Signed)
One sided headache few times a month- sometimes menstrual - lasts hours to all night  Disc lifestyle change - no caff/more water Disc use of nsaid otc immediately at onset with food Will update if worse or more frequent

## 2010-12-16 NOTE — Patient Instructions (Addendum)
Try an anti inflammatory medicine like ibuprofen or aleve early in a headache (with food) to get it under control  Continue yaz- let me know if not improved  Labs are good  Keep up the good health habits

## 2011-01-15 ENCOUNTER — Ambulatory Visit (INDEPENDENT_AMBULATORY_CARE_PROVIDER_SITE_OTHER): Payer: BC Managed Care – PPO | Admitting: Family Medicine

## 2011-01-15 ENCOUNTER — Encounter: Payer: Self-pay | Admitting: Family Medicine

## 2011-01-15 DIAGNOSIS — B9789 Other viral agents as the cause of diseases classified elsewhere: Secondary | ICD-10-CM

## 2011-01-15 DIAGNOSIS — B349 Viral infection, unspecified: Secondary | ICD-10-CM | POA: Insufficient documentation

## 2011-01-15 MED ORDER — MECLIZINE HCL 25 MG PO TABS: 25.0000 mg | ORAL_TABLET | Freq: Three times a day (TID) | ORAL | Status: AC | PRN

## 2011-01-15 NOTE — Patient Instructions (Signed)
If you get more dizzy - try the meclizine (it will sedate so use caution )  You could have a little vertigo from an inner ear virus  You may also end up with a head cold  If you get high fever or bad sore throat or no improvement in a week- call and let me know

## 2011-01-15 NOTE — Progress Notes (Signed)
Subjective:    Patient ID: Lindsey Clements, female    DOB: 1985-08-30, 25 y.o.   MRN: [id number]  HPI Saturday night - felt achey all over - ran fever of 101 -- slept for most of the night  Had some gassiness and bloated  Got up Sunday felt a bit dizzy and unsteady and achey but no fever  Some mild diarrhea that day Monday also mild diarrhea - and just felt off and tipsy tues went in to work -- achey and neck was sore (not stiff)  Really tired and spacey No sore throat   Really hoping she did not have mono again   No swelling of her LNs   No missed doses of yaz  No missed periods   Patient Active Problem List  Diagnoses  . HYPOGLYCEMIA, UNSPECIFIED  . IRRITABLE BOWEL SYNDROME  . OSGOOD SCHLATTER'S DISEASE  . SCOLIOSIS  . ALLERGIC RHINITIS  . PMDD (premenstrual dysphoric disorder)  . Routine general medical examination at a health care facility  . Gynecological examination  . Migraine  . Viral syndrome   Past Medical History  Diagnosis Date  . Scoliosis   . Mild acne   . Mononucleosis 12/10   Past Surgical History  Procedure Date  . Tonsillectomy   . Myringotomy    History  Substance Use Topics  . Smoking status: Never Smoker   . Smokeless tobacco: Never Used  . Alcohol Use: No   Family History  Problem Relation Age of Onset  . Hypertension Mother   . Thyroid disease Mother   . Miscarriages / Stillbirths Neg Hx    No Known Allergies Current Outpatient Prescriptions on File Prior to Visit  Medication Sig Dispense Refill  . cetirizine (ZYRTEC) 10 MG tablet Take OTC as directed as needed      . drospirenone-ethinyl estradiol (YAZ) 3-0.02 MG per tablet Take 1 tablet by mouth daily.  28 tablet  11  . fluticasone (FLONASE) 50 MCG/ACT nasal spray 2 sprays daily as needed.       . Multiple Vitamin (MULTIVITAMIN) tablet Take 1 tablet by mouth daily.             Review of Systems Review of Systems  Constitutional: Negative for fever, appetite change, and  unexpected weight change. pos for fatigue (fever is gone now) Eyes: Negative for pain and visual disturbance.  ENT neg for runny or stuffy nose or st , pos for ear fullness without change in hearing , eyes not swollen Respiratory: Negative for cough and shortness of breath.   Cardiovascular: Negative.  for cp or palpitations Gastrointestinal: Negative for nausea,  and constipation. pos for diarrhea that is better now  Genitourinary: Negative for urgency and frequency.  Skin: Negative for pallor. or rash  Neurological: Negative for weakness,, numbness and headaches. pos for dizziness  Hematological: Negative for adenopathy. Does not bruise/bleed easily.  Psychiatric/Behavioral: Negative for dysphoric mood. The patient is not nervous/anxious.          Objective:   Physical Exam  Constitutional: She appears well-developed and well-nourished. No distress.  HENT:  Head: Normocephalic and atraumatic.  Right Ear: External ear normal.  Left Ear: External ear normal.  Nose: Nose normal.  Mouth/Throat: Oropharynx is clear and moist. No oropharyngeal exudate.  Eyes: Conjunctivae and EOM are normal. Pupils are equal, round, and reactive to light.       Few beats of horizontal nystagmus bilat  Neck: Normal range of motion. Neck supple. No thyromegaly present.  Cardiovascular: Normal rate, regular rhythm and normal heart sounds.   Pulmonary/Chest: Effort normal and breath sounds normal. No respiratory distress. She has no wheezes. She has no rales.  Abdominal: Soft. Bowel sounds are normal. She exhibits no distension and no mass. There is no tenderness.       No organomegally  Musculoskeletal: Normal range of motion. She exhibits no edema and no tenderness.       No acute joint changes  Lymphadenopathy:    She has no cervical adenopathy.  Neurological: She is alert. She has normal reflexes. No cranial nerve deficit. She exhibits normal muscle tone. She displays a negative Romberg sign.  Coordination and gait normal.  Skin: Skin is warm and dry. No rash noted. No erythema. No pallor.  Psychiatric: She has a normal mood and affect.          Assessment & Plan:

## 2011-01-15 NOTE — Assessment & Plan Note (Signed)
With initial fever that is now resolved, and some mild ear fullness and vertigo sympotms (overall improved )  Suspect a viral labyrinthitis  Will give meclizine 25 mg px to hold in case dizziness worsens  Disc safety Decongestants otc if congestion or uri symptoms begin inst to get extra fluids and rest  Update if st or other symptoms or no imp  Do not think this is mono

## 2011-01-23 ENCOUNTER — Other Ambulatory Visit: Payer: Self-pay | Admitting: Family Medicine

## 2011-05-19 ENCOUNTER — Ambulatory Visit (INDEPENDENT_AMBULATORY_CARE_PROVIDER_SITE_OTHER): Payer: BC Managed Care – PPO | Admitting: Family Medicine

## 2011-05-19 ENCOUNTER — Encounter: Payer: Self-pay | Admitting: Family Medicine

## 2011-05-19 VITALS — BP 110/70 | HR 85 | Temp 98.3°F | Resp 18 | Wt 130.0 lb

## 2011-05-19 DIAGNOSIS — J029 Acute pharyngitis, unspecified: Secondary | ICD-10-CM

## 2011-05-19 NOTE — Progress Notes (Signed)
SUBJECTIVE:  Lindsey Clements is a 25 y.o. female who complains of coryza, congestion, sneezing, sore throat and myalgias for 3 days. She denies a history of anorexia, chest pain, fevers and wheezing and denies a history of asthma. Patient denies smoke cigarettes.   Patient Active Problem List  Diagnoses  . HYPOGLYCEMIA, UNSPECIFIED  . IRRITABLE BOWEL SYNDROME  . OSGOOD SCHLATTER'S DISEASE  . SCOLIOSIS  . ALLERGIC RHINITIS  . PMDD (premenstrual dysphoric disorder)  . Routine general medical examination at a health care facility  . Gynecological examination  . Migraine  . Viral syndrome   Past Medical History  Diagnosis Date  . Scoliosis   . Mild acne   . Mononucleosis 12/10   Past Surgical History  Procedure Date  . Tonsillectomy   . Myringotomy    History  Substance Use Topics  . Smoking status: Never Smoker   . Smokeless tobacco: Never Used  . Alcohol Use: No   Family History  Problem Relation Age of Onset  . Hypertension Mother   . Thyroid disease Mother   . Miscarriages / Stillbirths Neg Hx    No Known Allergies Current Outpatient Prescriptions on File Prior to Visit  Medication Sig Dispense Refill  . cetirizine (ZYRTEC) 10 MG tablet Take OTC as directed as needed      . drospirenone-ethinyl estradiol (YAZ) 3-0.02 MG per tablet Take 1 tablet by mouth daily.  28 tablet  11  . fluticasone (FLONASE) 50 MCG/ACT nasal spray USE 2 SPRAYS PER NOSTRIL DAILY  16 g  3  . meclizine (ANTIVERT) 25 MG tablet Take 1 tablet (25 mg total) by mouth 3 (three) times daily as needed for dizziness or nausea.  15 tablet  0  . Multiple Vitamin (MULTIVITAMIN) tablet Take 1 tablet by mouth daily.         The PMH, PSH, Social History, Family History, Medications, and allergies have been reviewed in [facility], and have been updated if relevant.  OBJECTIVE: BP 110/70  Pulse 85  Temp(Src) 98.3 F (36.8 C) (Oral)  Resp 18  Wt 130 lb (58.968 kg)  SpO2 97%  She appears well, vital signs are  as noted. Ears normal.  Throat and pharynx normal.  Neck supple. No adenopathy in the neck. Nose is congested. Sinuses non tender. The chest is clear, without wheezes or rales.  ASSESSMENT:  viral upper respiratory illness  PLAN: Symptomatic therapy suggested: push fluids, rest and return office visit prn if symptoms persist or worsen. Lack of antibiotic effectiveness discussed with her. Call or return to clinic prn if these symptoms worsen or fail to improve as anticipated.

## 2011-05-19 NOTE — Patient Instructions (Signed)
This is likely a virus. Drink lots of fluids.  Treat sympotmatically with Mucinex, nasal saline irrigation, and Tylenol/Ibuprofen. Also try claritin D or zyrtec D over the counter- two times a day as needed ( have to sign for them at pharmacy). You can use warm compresses.  Cough suppressant at night. Call if not improving as expected in 5-7 days.    

## 2011-09-01 ENCOUNTER — Other Ambulatory Visit: Payer: Self-pay | Admitting: Family Medicine

## 2011-11-18 ENCOUNTER — Other Ambulatory Visit: Payer: Self-pay | Admitting: *Deleted

## 2011-11-18 MED ORDER — FLUTICASONE PROPIONATE 50 MCG/ACT NA SUSP: NASAL | Status: DC

## 2011-11-18 NOTE — Telephone Encounter (Signed)
Done

## 2011-12-15 ENCOUNTER — Other Ambulatory Visit: Payer: Self-pay | Admitting: Obstetrics and Gynecology

## 2012-03-06 ENCOUNTER — Other Ambulatory Visit: Payer: Self-pay | Admitting: Family Medicine

## 2013-07-08 ENCOUNTER — Ambulatory Visit (INDEPENDENT_AMBULATORY_CARE_PROVIDER_SITE_OTHER): Payer: BC Managed Care – PPO | Admitting: Family Medicine

## 2013-07-08 ENCOUNTER — Telehealth: Payer: Self-pay | Admitting: *Deleted

## 2013-07-08 ENCOUNTER — Encounter: Payer: Self-pay | Admitting: Family Medicine

## 2013-07-08 VITALS — BP 102/78 | HR 78 | Temp 98.3°F | Ht 63.5 in | Wt 140.0 lb

## 2013-07-08 DIAGNOSIS — H01139 Eczematous dermatitis of unspecified eye, unspecified eyelid: Secondary | ICD-10-CM | POA: Insufficient documentation

## 2013-07-08 MED ORDER — TRIAMCINOLONE ACETONIDE 0.025 % EX CREA: 1.0000 "application " | TOPICAL_CREAM | Freq: Two times a day (BID) | CUTANEOUS | Status: DC

## 2013-07-08 MED ORDER — DESONIDE 0.05 % EX CREA: TOPICAL_CREAM | Freq: Every day | CUTANEOUS | Status: DC | PRN

## 2013-07-08 NOTE — Telephone Encounter (Signed)
I just sent triamcinolone instead - if this is still too $$ (I cannot tell on epic what her ins prefers)- then ask pharmacist to give me some options for low potency steroid creams for eczema thanks

## 2013-07-08 NOTE — Progress Notes (Signed)
Subjective:    Patient ID: Lindsey SimsHollie A Clements, female    DOB: Jan 04, 1986, 28 y.o.   MRN: [id number]  HPI Here for eyelid problems   She is a hairdresser in a salon  Recently started a facial treatment for mild acne (2 mo ago)   Used the cleanser on eyelids to gel salycilic acid  Switched to another brand  Still having problems   She also has eczema on forehead and temples No dandruff   Patient Active Problem List   Diagnosis Date Noted  . Viral syndrome 01/15/2011  . Gynecological examination 12/16/2010  . Migraine 12/16/2010  . Routine general medical examination at a health care facility 12/08/2010  . PMDD (premenstrual dysphoric disorder) 11/20/2010  . HYPOGLYCEMIA, UNSPECIFIED 05/24/2009  . ALLERGIC RHINITIS 08/14/2008  . IRRITABLE BOWEL SYNDROME 09/11/2006  . 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 SCOLIOSIS 09/11/2006   Past Medical History  Diagnosis Date  . Scoliosis   . Mild acne   . Mononucleosis 12/10   Past Surgical History  Procedure Laterality Date  . Tonsillectomy    . Myringotomy     History  Substance Use Topics  . Smoking status: Never Smoker   . Smokeless tobacco: Never Used  . Alcohol Use: No   Family History  Problem Relation Age of Onset  . Hypertension Mother   . Thyroid disease Mother   . Miscarriages / Stillbirths Neg Hx    No Known Allergies Current Outpatient Prescriptions on File Prior to Visit  Medication Sig Dispense Refill  . fluticasone (FLONASE) 50 MCG/ACT nasal spray USE 2 SPRAYS IN EACH NOSTRIL ONCE A DAY  48 g  3  . Multiple Vitamin (MULTIVITAMIN) tablet Take 1 tablet by mouth daily.        . drospirenone-ethinyl estradiol (YAZ) 3-0.02 MG per tablet Take 1 tablet by mouth daily.  28 tablet  11   No current facility-administered medications on file prior to visit.      Review of Systems Review of Systems  Constitutional: Negative for fever, appetite change, fatigue and unexpected weight change.  Eyes: Negative for  pain and visual disturbance.  Respiratory: Negative for cough and shortness of breath.   Cardiovascular: Negative for cp or palpitations    Gastrointestinal: Negative for nausea, diarrhea and constipation.  Genitourinary: Negative for urgency and frequency.  Skin: Negative for pallor and pos for itchy rash   Neurological: Negative for weakness, light-headedness, numbness and headaches.  Hematological: Negative for adenopathy. Does not bruise/bleed easily.  Psychiatric/Behavioral: Negative for dysphoric mood. The patient is not nervous/anxious.         Objective:   Physical Exam  Constitutional: She appears well-developed and well-nourished. No distress.  HENT:  Head: Normocephalic.  Right Ear: External ear normal.  Mouth/Throat: Oropharynx is clear and moist.  Eyes: Conjunctivae and EOM are normal. Pupils are equal, round, and reactive to light. Right eye exhibits no discharge. Left eye exhibits no discharge. No scleral icterus.  Neck: Normal range of motion. Neck supple.  Cardiovascular: Normal rate and regular rhythm.   Pulmonary/Chest: Effort normal and breath sounds normal. She has no wheezes.  Lymphadenopathy:    She has no cervical adenopathy.  Neurological: She is alert. She has normal reflexes.  Skin: Skin is warm and dry. Rash noted. There is erythema. No pallor.  Dry patches on forehead with scant erythema and scale Also involving upper eyelids  No swelling/vesicles or open areas  Psychiatric: She has a normal mood and affect.  Assessment & Plan:

## 2013-07-08 NOTE — Progress Notes (Signed)
Pre-visit discussion using our clinic review tool. No additional management support is needed unless otherwise documented below in the visit note.  

## 2013-07-08 NOTE — Patient Instructions (Signed)
Use the desonide cream to affected areas of face and eyelids very sparingly once daily  Get away from salicylic acid products  Stick with color and fragrance free products  If symptoms continue let me know

## 2013-07-08 NOTE — Telephone Encounter (Signed)
Pt called and stated that the Rx you prescribed her today for her eye is too expensive and she will need an alternative medication, please [facility]

## 2013-07-08 NOTE — Telephone Encounter (Signed)
Pt notified new Rx sent to pharmacy, pt let me know pharmacy already told her it would only be $4 so she will get it

## 2013-07-10 NOTE — Assessment & Plan Note (Signed)
Also a few patches on face Suspect this relates to new acne cleanser with salycilic acid  Will change to a gentle cleanser like cetaphil Trial of triamcinolone to aff areas (ins does not cover desonide) Moisturizer prn Update if not starting to improve in a week or if worsening

## 2013-07-12 ENCOUNTER — Encounter: Payer: Self-pay | Admitting: Internal Medicine

## 2013-07-12 ENCOUNTER — Ambulatory Visit (INDEPENDENT_AMBULATORY_CARE_PROVIDER_SITE_OTHER): Payer: BC Managed Care – PPO | Admitting: Internal Medicine

## 2013-07-12 VITALS — BP 110/70 | HR 113 | Temp 99.2°F | Wt 141.0 lb

## 2013-07-12 DIAGNOSIS — J209 Acute bronchitis, unspecified: Secondary | ICD-10-CM

## 2013-07-12 MED ORDER — HYDROCODONE-HOMATROPINE 5-1.5 MG/5ML PO SYRP: 5.0000 mL | ORAL_SOLUTION | Freq: Three times a day (TID) | ORAL | Status: DC | PRN

## 2013-07-12 MED ORDER — AZITHROMYCIN 250 MG PO TABS: ORAL_TABLET | ORAL | Status: DC

## 2013-07-12 NOTE — Progress Notes (Signed)
HPI  Pt presents to the clinic today with c/o cold symptoms x 2 days. She has dry/burning cough, nasal congestion, fever, and chills. She denies shortness of breath, chest pain, or difficulty breathing. She has not tried anything OTC. She denies any sick contacts.   Review of Systems      Past Medical History  Diagnosis Date  . Scoliosis   . Mild acne   . Mononucleosis 12/10    Family History  Problem Relation Age of Onset  . Hypertension Mother   . Thyroid disease Mother   . Miscarriages / Stillbirths Neg Hx     History   Social History  . Marital Status: Married    Spouse Name: N/A    Number of Children: N/A  . Years of Education: N/A   Occupational History  . hairdresser   . [employer]    Social History Main Topics  . Smoking status: Never Smoker   . Smokeless tobacco: Never Used  . Alcohol Use: No  . Drug Use: No  . Sexual Activity: Not on file   Other Topics Concern  . Not on file   Social History Narrative   Goes to gym for exercise    No Known Allergies   Constitutional: Positive headache, fatigue and fever. Denies abrupt weight changes.  HEENT:  Positive sore throat and nasal congestion. Denies eye redness, eye pain, pressure behind the eyes, facial pain, , ear pain, ringing in the ears, wax buildup, runny nose or bloody nose. Respiratory: Positive cough. Denies difficulty breathing or shortness of breath.  Cardiovascular: Denies chest pain, chest tightness, palpitations or swelling in the hands or feet.   No other specific complaints in a complete review of systems (except as listed in HPI above).  Objective:   BP 110/70  Pulse 113  Temp(Src) 99.2 F (37.3 C) (Oral)  Wt 141 lb (63.957 kg)  SpO2 99% Wt Readings from Last 3 Encounters:  07/12/13 141 lb (63.957 kg)  07/08/13 140 lb (63.504 kg)  05/19/11 130 lb (58.968 kg)     General: Appears his stated age, well developed, well nourished in NAD. HEENT: Head: normal shape and size;  Eyes: sclera white, no icterus, conjunctiva pink, PERRLA and EOMs intact; Ears: Tm's gray and intact, normal light reflex; Nose: mucosa pink and moist, septum midline; Throat/Mouth: + PND. Teeth present, mucosa erythematous and moist, no exudate noted, no lesions or ulcerations noted.  Neck: Mild cervical lymphadenopathy. Neck supple, trachea midline. No massses, lumps or thyromegaly present.  Cardiovascular: Normal rate and rhythm. S1,S2 noted.  No murmur, rubs or gallops noted. No JVD or BLE edema. No carotid bruits noted. Pulmonary/Chest: Normal effort and positive vesicular breath sounds. No respiratory distress. No wheezes, rales or ronchi noted.      Assessment & Plan:   Upper Respiratory Infection:  Get some rest and drink plenty of water Do salt water gargles for the sore throat eRx for Azithromax x 5 days eRx for Hycodan cough syrup  RTC as needed or if symptoms persist.  Arryn Terrones, Jacques Earthlyourtney S, Student-NP

## 2013-07-12 NOTE — Patient Instructions (Signed)
Acute Bronchitis Bronchitis is inflammation of the airways that extend from the windpipe into the lungs (bronchi). The inflammation often causes mucus to develop. This leads to a cough, which is the most common symptom of bronchitis.  In acute bronchitis, the condition usually develops suddenly and goes away over time, usually in a couple weeks. Smoking, allergies, and asthma can make bronchitis worse. Repeated episodes of bronchitis may cause further lung problems.  CAUSES Acute bronchitis is most often caused by the same virus that causes a cold. The virus can spread from person to person (contagious).  SIGNS AND SYMPTOMS   Cough.   Fever.   Coughing up mucus.   Body aches.   Chest congestion.   Chills.   Shortness of breath.   Sore throat.  DIAGNOSIS  Acute bronchitis is usually diagnosed through a physical exam. Tests, such as chest X-rays, are sometimes done to rule out other conditions.  TREATMENT  Acute bronchitis usually goes away in a couple weeks. Often times, no medical treatment is necessary. Medicines are sometimes given for relief of fever or cough. Antibiotics are usually not needed but may be prescribed in certain situations. In some cases, an inhaler may be recommended to help reduce shortness of breath and control the cough. A cool mist vaporizer may also be used to help thin bronchial secretions and make it easier to clear the chest.  HOME CARE INSTRUCTIONS  Get plenty of rest.   Drink enough fluids to keep your urine clear or pale yellow (unless you have a medical condition that requires fluid restriction). Increasing fluids may help thin your secretions and will prevent dehydration.   Only take over-the-counter or prescription medicines as directed by your health care provider.   Avoid smoking and secondhand smoke. Exposure to cigarette smoke or irritating chemicals will make bronchitis worse. If you are a smoker, consider using nicotine gum or skin  patches to help control withdrawal symptoms. Quitting smoking will help your lungs heal faster.   Reduce the chances of another bout of acute bronchitis by washing your hands frequently, avoiding people with cold symptoms, and trying not to touch your hands to your mouth, nose, or eyes.   Follow up with your health care provider as directed.  SEEK MEDICAL CARE IF: Your symptoms do not improve after 1 week of treatment.  SEEK IMMEDIATE MEDICAL CARE IF:  You develop an increased fever or chills.   You have chest pain.   You have severe shortness of breath.  You have bloody sputum.   You develop dehydration.  You develop fainting.  You develop repeated vomiting.  You develop a severe headache. MAKE SURE YOU:   Understand these instructions.  Will watch your condition.  Will get help right away if you are not doing well or get worse. Document Released: 06/26/2004 Document Revised: 01/19/2013 Document Reviewed: 11/09/2012 Exit[employer] Patient Information 2014 [employer].  

## 2013-07-12 NOTE — Progress Notes (Signed)
Pre-visit discussion using our clinic review tool. No additional management support is needed unless otherwise documented below in the visit note.  

## 2013-07-12 NOTE — Progress Notes (Signed)
HPI  Pt presents to the clinic today with c/o cough, chest congestion, shortness of breath, fever and chills. She reports this started 3 days ago. The cough is productive of thick yellow mucous. She has not taken anything OTC. She has a history of allergies but denies breathing problems. She has had sick contacts.  Review of Systems      Past Medical History  Diagnosis Date  . Scoliosis   . Mild acne   . Mononucleosis 12/10    Family History  Problem Relation Age of Onset  . Hypertension Mother   . Thyroid disease Mother   . Miscarriages / Stillbirths Neg Hx     History   Social History  . Marital Status: Married    Spouse Name: N/A    Number of Children: N/A  . Years of Education: N/A   Occupational History  . hairdresser   . [employer]    Social History Main Topics  . Smoking status: Never Smoker   . Smokeless tobacco: Never Used  . Alcohol Use: No  . Drug Use: No  . Sexual Activity: Not on file   Other Topics Concern  . Not on file   Social History Narrative   Goes to gym for exercise    No Known Allergies   Constitutional: Positive headache, fatigue and fever. Denies abrupt weight changes.  HEENT:  Positive sore throat. Denies eye redness, eye pain, pressure behind the eyes, facial pain, nasal congestion, ear pain, ringing in the ears, wax buildup, runny nose or bloody nose. Respiratory: Positive cough. Denies difficulty breathing or shortness of breath.  Cardiovascular: Denies chest pain, chest tightness, palpitations or swelling in the hands or feet.   No other specific complaints in a complete review of systems (except as listed in HPI above).  Objective:   BP 110/70  Pulse 113  Temp(Src) 99.2 F (37.3 C) (Oral)  Wt 141 lb (63.957 kg)  SpO2 99% Wt Readings from Last 3 Encounters:  07/12/13 141 lb (63.957 kg)  07/08/13 140 lb (63.504 kg)  05/19/11 130 lb (58.968 kg)     General: Appears her stated age, well developed, well nourished in  NAD. HEENT: Head: normal shape and size; Eyes: sclera white, no icterus, conjunctiva pink, PERRLA and EOMs intact; Ears: Tm's gray and intact, normal light reflex; Nose: mucosa pink and moist, septum midline; Throat/Mouth: + PND. Teeth present, mucosa erythematous and moist, no exudate noted, no lesions or ulcerations noted.  Neck: Mild cervical lymphadenopathy. Neck supple, trachea midline. No massses, lumps or thyromegaly present.  Cardiovascular: Normal rate and rhythm. S1,S2 noted.  No murmur, rubs or gallops noted. No JVD or BLE edema. No carotid bruits noted. Pulmonary/Chest: Normal effort and scattered rhonchi throughout. No respiratory distress. No wheezes, rales or noted.      Assessment & Plan:   Acute Bronchitis:  Get some rest and drink plenty of water Do salt water gargles for the sore throat eRx for Azithromax x 5 days eRx for Hycodan cough syrup  RTC as needed or if symptoms persist.

## 2013-07-13 ENCOUNTER — Other Ambulatory Visit: Payer: Self-pay | Admitting: Family Medicine

## 2013-07-13 NOTE — Telephone Encounter (Signed)
Please refill times one  

## 2013-07-13 NOTE — Telephone Encounter (Signed)
Pt left v/m requesting refill triamcinolone cream to [facility]; pt has misplaced the medication and cannot find it. Please advise.

## 2013-07-13 NOTE — Telephone Encounter (Signed)
done

## 2013-07-15 ENCOUNTER — Telehealth: Payer: Self-pay | Admitting: Family Medicine

## 2013-07-15 NOTE — Telephone Encounter (Signed)
Patient Information:  [facility]  Phone: [phone]  Patient: Lindsey Clements, Lindsey Clements  Gender: Female  DOB: 1986/04/06  Age: 28 Years  PCP[facility]  Pregnant: No  Office Follow Up:  Does the office need to follow up with this patient?: No  Instructions For The Office: N/Clements   Symptoms  Reason For Call & Symptoms: Patient calling.  Reports sore throat onset 07/10/13, cough onset 07/11/13.  Fever onset 07/12/13 only.  Emergent symptoms ruled out.  See Within 3 Days in Office due to Allergy symptoms are present.  Caller reports symptoms improved since she was seen.  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  Home care and parameters for callback given.  Advised home care since she reports improvement.  She will call for new or worsening symptoms or symptoms that linger longer than outlined in home care instructions.  Reviewed Health History In EMR: Yes  Reviewed Medications In EMR: Yes  Reviewed Allergies In EMR: Yes  Reviewed Surgeries / Procedures: Yes  Date of Onset of Symptoms: 07/10/2013  Treatments Tried: Z-Pack, Rx Cough syrup  Treatments Tried Worked: Yes OB / GYN:  LMP: 07/14/2013  Guideline(s) Used:  Cough  Disposition Per Guideline:   See Within 3 Days in Office  Reason For Disposition Reached:   Allergy symptoms are also present (e.g., itchy eyes, clear nasal discharge, postnasal drip)  Advice Given:  Reassurance  Coughing is the way that our lungs remove irritants and mucus. It helps protect our lungs from getting pneumonia.  You can get Clements dry hacking cough after Clements chest cold. Sometimes this type of cough can last 1-3 weeks, and be worse at night.  You can also get Clements cough after being exposed to irritating substances like smoke, strong perfumes, and dust.  Cough Medicines:  OTC Cough Drops: Cough drops can help Clements lot, especially for mild coughs. They reduce coughing by soothing your irritated  throat and removing that tickle sensation in the back of the throat. Cough drops also have the advantage of portability - you can carry them with you.  Home Remedy - Hard Candy: Hard candy works just as well as medicine-flavored OTC cough drops. Diabetics should use sugar-free candy.  Coughing Spasms:  Drink warm fluids. Inhale warm mist (Reason: both relax the airway and loosen up the phlegm).  Suck on cough drops or hard candy to coat the irritated throat.  Prevent Dehydration:  Drink adequate liquids.  This will help soothe an irritated or dry throat and loosen up the phlegm.  Avoid Tobacco Smoke:  Smoking or being exposed to smoke makes coughs much worse.  Expected Course:   The expected course depends on what is causing the cough.  Viral bronchitis (chest cold) causes Clements cough that lasts 1 to 3 weeks. Sometimes you may cough up lots of phlegm (sputum, mucus). The mucus can normally be white, gray, yellow, or green.  Call Back If:  Difficulty breathing  Cough lasts more than 3 weeks  Fever lasts > 3 days  You become worse.  RN Overrode Recommendation:  Document Patient  Home care and continue to follow instructions given at time of appointment.  Follow up as per instructions as needed.

## 2013-08-24 ENCOUNTER — Ambulatory Visit (INDEPENDENT_AMBULATORY_CARE_PROVIDER_SITE_OTHER): Payer: BC Managed Care – PPO | Admitting: Family Medicine

## 2013-08-24 ENCOUNTER — Encounter: Payer: Self-pay | Admitting: Family Medicine

## 2013-08-24 VITALS — BP 98/66 | HR 91 | Temp 98.3°F | Ht 63.5 in | Wt 138.5 lb

## 2013-08-24 DIAGNOSIS — L259 Unspecified contact dermatitis, unspecified cause: Secondary | ICD-10-CM

## 2013-08-24 NOTE — Progress Notes (Signed)
Pre visit review using our clinic review tool, if applicable. No additional management support is needed unless otherwise documented below in the visit note. 

## 2013-08-24 NOTE — Progress Notes (Signed)
Subjective:    Patient ID: Lindsey Clements, female    DOB: August 20, 1985, 28 y.o.   MRN: [id number]  HPI Here for facial dermatitis - she was here before for this   She stopped using the salicylic acid cleanser  Started using another cleanser w/o it   (since last visit) -- she stopped it   (? If she is intolerant to that brand)  Whole face itches  Also dove for sensitive skin   (for a week that is all she has used) Moisturizer- "vital C" Makeup- no change / foundation is the same for years   No dandruff   Got better for a while and then worse   Used the triamcinolone cream and that helped   Spares the folds around nose and mouth   No hives No nasal allergic symptoms   Patient Active Problem List   Diagnosis Date Noted  . Gynecological examination 12/16/2010  . Migraine 12/16/2010  . Routine general medical examination at a health care facility 12/08/2010  . PMDD (premenstrual dysphoric disorder) 11/20/2010  . ALLERGIC RHINITIS 08/14/2008  . IRRITABLE BOWEL SYNDROME 09/11/2006  . 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 SCOLIOSIS 09/11/2006   Past Medical History  Diagnosis Date  . Scoliosis   . Mild acne   . Mononucleosis 12/10   Past Surgical History  Procedure Laterality Date  . Tonsillectomy    . Myringotomy     History  Substance Use Topics  . Smoking status: Never Smoker   . Smokeless tobacco: Never Used  . Alcohol Use: No   Family History  Problem Relation Age of Onset  . Hypertension Mother   . Thyroid disease Mother   . Miscarriages / Stillbirths Neg Hx    No Known Allergies Current Outpatient Prescriptions on File Prior to Visit  Medication Sig Dispense Refill  . Multiple Vitamin (MULTIVITAMIN) tablet Take 1 tablet by mouth daily.        Marland Kitchen. triamcinolone (KENALOG) 0.025 % cream APPLY 2 TIMES A DAY  15 g  0  . fluticasone (FLONASE) 50 MCG/ACT nasal spray USE 2 SPRAYS IN EACH NOSTRIL ONCE A DAY  48 g  3   No current facility-administered  medications on file prior to visit.      Review of Systems Review of Systems  Constitutional: Negative for fever, appetite change, fatigue and unexpected weight change.  Eyes: Negative for pain and visual disturbance.  Respiratory: Negative for cough and shortness of breath.   Cardiovascular: Negative for cp or palpitations    Gastrointestinal: Negative for nausea, diarrhea and constipation.  Genitourinary: Negative for urgency and frequency.  Skin: Negative for pallor and pos for itchy rash on face    Neurological: Negative for weakness, light-headedness, numbness and headaches.  Hematological: Negative for adenopathy. Does not bruise/bleed easily.  Psychiatric/Behavioral: Negative for dysphoric mood. The patient is not nervous/anxious.         Objective:   Physical Exam  Constitutional: She appears well-developed and well-nourished. No distress.  HENT:  Head: Normocephalic and atraumatic.  Right Ear: External ear normal.  Left Ear: External ear normal.  Nose: Nose normal.  Mouth/Throat: Oropharynx is clear and moist.  Eyes: Conjunctivae and EOM are normal. Pupils are equal, round, and reactive to light. Right eye exhibits no discharge. Left eye exhibits no discharge.  Neck: Normal range of motion. Neck supple.  Pulmonary/Chest: Effort normal and breath sounds normal. She has no wheezes.  Lymphadenopathy:    She has no cervical adenopathy.  Skin: Skin is warm and dry. Rash noted. There is erythema.  Dry scaley rash on face - upper and lower eyelids and cheeks   No open areas or vesicles  Psychiatric: She has a normal mood and affect.          Assessment & Plan:

## 2013-08-24 NOTE — Patient Instructions (Signed)
Please stop up front for referral to dermatology  Use a "free " clothing detergent and do not use fabric softener Change facial cleanser to Cetaphil  Change moisturizer to Lubriderm for sensitive skin

## 2013-08-25 NOTE — Assessment & Plan Note (Signed)
Recurrent on eyelids and cheeks  ? Cause  Disc chemicals/scents/ nail polish--- ? Less likely foods  Will change to cetaphil cleanser and use free clothing detergent/ no fabric softener  Has failed triamcinolone cream  Rash is not in dist for seb derm Ref to derm for further eval

## 2013-08-30 ENCOUNTER — Encounter: Payer: Self-pay | Admitting: Family Medicine

## 2013-08-30 ENCOUNTER — Ambulatory Visit (INDEPENDENT_AMBULATORY_CARE_PROVIDER_SITE_OTHER): Payer: BC Managed Care – PPO | Admitting: Family Medicine

## 2013-08-30 VITALS — BP 110/64 | HR 103 | Temp 98.1°F | Ht 63.5 in | Wt 135.2 lb

## 2013-08-30 DIAGNOSIS — J020 Streptococcal pharyngitis: Secondary | ICD-10-CM | POA: Insufficient documentation

## 2013-08-30 LAB — POCT RAPID STREP A (OFFICE): Rapid Strep A Screen: POSITIVE — AB

## 2013-08-30 MED ORDER — AZITHROMYCIN 250 MG PO TABS: ORAL_TABLET | ORAL | Status: DC

## 2013-08-30 NOTE — Progress Notes (Signed)
Subjective:    Patient ID: Lindsey Clements, female    DOB: [birth year], 28 y.o.   MRN: [id number]  HPI Here for ST   Has a child with strep in the house -on abx since sat  She was prone to strep as a kid   Sore throat started yesterday-woke up with it - was significantly sore  Gargled with warm salt water Took tylenol cold and flu last night  Lymph nodes are tender   Thinks she had fever yesterday- chills / hot and cold and aches   Rapid strep pos today   Patient Active Problem List   Diagnosis Date Noted  . Contact dermatitis 08/24/2013  . Gynecological examination 12/16/2010  . Migraine 12/16/2010  . Routine general medical examination at a health care facility 12/08/2010  . PMDD (premenstrual dysphoric disorder) 11/20/2010  . ALLERGIC RHINITIS 08/14/2008  . IRRITABLE BOWEL SYNDROME 09/11/2006  . 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 SCOLIOSIS 09/11/2006   Past Medical History  Diagnosis Date  . Scoliosis   . Mild acne   . Mononucleosis 12/10   Past Surgical History  Procedure Laterality Date  . Tonsillectomy    . Myringotomy     History  Substance Use Topics  . Smoking status: Never Smoker   . Smokeless tobacco: Never Used  . Alcohol Use: No   Family History  Problem Relation Age of Onset  . Hypertension Mother   . Thyroid disease Mother   . Miscarriages / Stillbirths Neg Hx    No Known Allergies Current Outpatient Prescriptions on File Prior to Visit  Medication Sig Dispense Refill  . fluticasone (FLONASE) 50 MCG/ACT nasal spray USE 2 SPRAYS IN EACH NOSTRIL ONCE A DAY  48 g  3  . Multiple Vitamin (MULTIVITAMIN) tablet Take 1 tablet by mouth daily.        Marland Kitchen triamcinolone (KENALOG) 0.025 % cream APPLY 2 TIMES A DAY  15 g  0   No current facility-administered medications on file prior to visit.     Patient Active Problem List   Diagnosis Date Noted  . Contact dermatitis 08/24/2013  . Gynecological examination 12/16/2010  . Migraine 12/16/2010    . Routine general medical examination at a health care facility 12/08/2010  . PMDD (premenstrual dysphoric disorder) 11/20/2010  . ALLERGIC RHINITIS 08/14/2008  . IRRITABLE BOWEL SYNDROME 09/11/2006  . 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 SCOLIOSIS 09/11/2006   Past Medical History  Diagnosis Date  . Scoliosis   . Mild acne   . Mononucleosis 12/10   Past Surgical History  Procedure Laterality Date  . Tonsillectomy    . Myringotomy     History  Substance Use Topics  . Smoking status: Never Smoker   . Smokeless tobacco: Never Used  . Alcohol Use: No   Family History  Problem Relation Age of Onset  . Hypertension Mother   . Thyroid disease Mother   . Miscarriages / Stillbirths Neg Hx    No Known Allergies Current Outpatient Prescriptions on File Prior to Visit  Medication Sig Dispense Refill  . fluticasone (FLONASE) 50 MCG/ACT nasal spray USE 2 SPRAYS IN EACH NOSTRIL ONCE A DAY  48 g  3  . Multiple Vitamin (MULTIVITAMIN) tablet Take 1 tablet by mouth daily.        Marland Kitchen triamcinolone (KENALOG) 0.025 % cream APPLY 2 TIMES A DAY  15 g  0   No current facility-administered medications on file prior to visit.    Review of  Systems Review of Systems  Constitutional: Negative for , appetite change, and unexpected weight change.  Eyes: Negative for pain and visual disturbance.  ENT pos for ST and headache , neg for nasal symptoms  Respiratory: Negative for wheeze  and shortness of breath.   Cardiovascular: Negative for cp or palpitations    Gastrointestinal: Negative for nausea, diarrhea and constipation.  Genitourinary: Negative for urgency and frequency.  Skin: Negative for pallor or rash   Neurological: Negative for weakness, light-headedness, numbness and headaches.  Hematological: Negative for adenopathy. Does not bruise/bleed easily.  Psychiatric/Behavioral: Negative for dysphoric mood. The patient is not nervous/anxious.         Objective:   Physical Exam   Constitutional: She appears well-developed and well-nourished. No distress.  HENT:  Head: Normocephalic and atraumatic.  Right Ear: External ear normal.  Left Ear: External ear normal.  Nose: Nose normal.  Mouth/Throat: No oropharyngeal exudate.  Throat is erythematous without swelling or exudate   Eyes: Conjunctivae and EOM are normal. Pupils are equal, round, and reactive to light. Right eye exhibits no discharge. Left eye exhibits no discharge.  Neck: Normal range of motion. Neck supple.  Pulmonary/Chest: Effort normal and breath sounds normal. No respiratory distress. She has no wheezes. She has no rales.  Lymphadenopathy:    She has cervical adenopathy.  Neurological: She is alert.  Skin: Skin is warm and dry. No rash noted.  Psychiatric: She has a normal mood and affect.          Assessment & Plan:

## 2013-08-30 NOTE — Assessment & Plan Note (Signed)
After exp to her son with it  tx with zpak - ? Rxn to amox in the past but it was in the setting of mono so doubtful allergy however  Disc symptomatic care - see instructions on AVS  Update if not starting to improve in a week or if worsening

## 2013-08-30 NOTE — Progress Notes (Signed)
Pre visit review using our clinic review tool, if applicable. No additional management support is needed unless otherwise documented below in the visit note. 

## 2013-08-30 NOTE — Patient Instructions (Signed)
Take the zithromax for strep throat  Drink fluids and rest  Salt water gargle/ tylenol or motrin help with sore throat and fever  Chloraseptic throat spray is also helpful  Update if not starting to improve in a week or if worsening   No work until after 12:00 tomorrow

## 2013-09-18 ENCOUNTER — Encounter (HOSPITAL_COMMUNITY): Payer: Self-pay | Admitting: Emergency Medicine

## 2013-09-18 ENCOUNTER — Emergency Department (HOSPITAL_COMMUNITY)
Admission: EM | Admit: 2013-09-18 | Discharge: 2013-09-18 | Disposition: A | Discharge status: Home / Self Care | Payer: BC Managed Care – PPO | Attending: Emergency Medicine | Admitting: Emergency Medicine

## 2013-09-18 DIAGNOSIS — S01112A Laceration without foreign body of left eyelid and periocular area, initial encounter: Secondary | ICD-10-CM

## 2013-09-18 DIAGNOSIS — L708 Other acne: Secondary | ICD-10-CM | POA: Insufficient documentation

## 2013-09-18 DIAGNOSIS — S01119A Laceration without foreign body of unspecified eyelid and periocular area, initial encounter: Secondary | ICD-10-CM | POA: Insufficient documentation

## 2013-09-18 DIAGNOSIS — Y929 Unspecified place or not applicable: Secondary | ICD-10-CM | POA: Insufficient documentation

## 2013-09-18 DIAGNOSIS — W278XXA Contact with other nonpowered hand tool, initial encounter: Secondary | ICD-10-CM | POA: Insufficient documentation

## 2013-09-18 DIAGNOSIS — Y93H3 Activity, building and construction: Secondary | ICD-10-CM | POA: Insufficient documentation

## 2013-09-18 DIAGNOSIS — M4120 Other idiopathic scoliosis, site unspecified: Secondary | ICD-10-CM | POA: Insufficient documentation

## 2013-09-18 DIAGNOSIS — Z880 Allergy status to penicillin: Secondary | ICD-10-CM | POA: Insufficient documentation

## 2013-09-18 DIAGNOSIS — Z23 Encounter for immunization: Secondary | ICD-10-CM | POA: Insufficient documentation

## 2013-09-18 MED ORDER — NAPROXEN 500 MG PO TABS: 500.0000 mg | ORAL_TABLET | Freq: Two times a day (BID) | ORAL | Status: DC

## 2013-09-18 MED ORDER — TETRACAINE HCL 0.5 % OP SOLN
2.0000 [drp] | Freq: Once | OPHTHALMIC | Status: AC
Start: 2013-09-18 — End: 2013-09-18
  Administered 2013-09-18: 2 [drp] via OPHTHALMIC

## 2013-09-18 MED ORDER — TETANUS-DIPHTH-ACELL PERTUSSIS 5-2.5-18.5 LF-MCG/0.5 IM SUSP
0.5000 mL | Freq: Once | INTRAMUSCULAR | Status: AC
  Administered 2013-09-18: 0.5 mL via INTRAMUSCULAR
  Filled 2013-09-18: qty 0.5

## 2013-09-18 MED ORDER — CEPHALEXIN 500 MG PO CAPS: 500.0000 mg | ORAL_CAPSULE | Freq: Four times a day (QID) | ORAL | Status: DC

## 2013-09-18 NOTE — ED Notes (Signed)
Pt states she stabbed herself in the eye with a screwdriver while removing a door. L eyelid with open lac, no active bleeding present. Hematoma noted to eyelid. Pt denies loss or change in vision.

## 2013-09-18 NOTE — ED Provider Notes (Signed)
CSN: [id number]     Arrival date & time 09/18/13  1803 History  This chart was scribed for non-physician practitioner working with Gwyneth SproutWhitney Plunkett, MD by Elveria Risingimelie Horne, ED Scribe. This patient was seen in room TR05C/TR05C and the patient's care was started at 6:28 PM.   Chief Complaint  Patient presents with  . Eye Injury      The history is provided by the patient. No language interpreter was used.   HPI Comments: Lindsey Clements is a 28 y.o. female who presents to the Emergency Department with a laceration to her left eyelid due to an injury that occurred while unhinging a door twenty minutes prior arrival. Patient reports that while unscrewing a door hinge with a screwdriver, her hand slipped and she cut her eyelid. The laceration covers the full length of her lid. Patient is still able to move her left eye and reports no visual changes. Patient does not wear glasses or corrective lens. Patient is uncertain of last Tetanus and is due for an update.   Past Medical History  Diagnosis Date  . Scoliosis   . Mild acne   . Mononucleosis 12/10   Past Surgical History  Procedure Laterality Date  . Tonsillectomy    . Myringotomy    . Wisdom tooth extraction     Family History  Problem Relation Age of Onset  . Hypertension Mother   . Thyroid disease Mother   . Miscarriages / Stillbirths Neg Hx    History  Substance Use Topics  . Smoking status: Never Smoker   . Smokeless tobacco: Never Used  . Alcohol Use: No   OB History   Grav Para Term Preterm Abortions TAB SAB Ect Mult Living                 Review of Systems  Eyes: Positive for pain. Negative for visual disturbance.  Skin:       Laceration.      Allergies  Penicillins  Home Medications   Prior to Admission medications   Medication Sig Start Date End Date Taking? Authorizing Provider  azithromycin (ZITHROMAX) 250 MG tablet Take 2 pills by mouth today and then 1 pill daily for 4 days 08/30/13   Judy PimpleMarne A Tower, MD   fluticasone [facility](FLONASE) 50 MCG/ACT nasal spray USE 2 SPRAYS IN EACH NOSTRIL ONCE A DAY 03/06/12   Judy PimpleMarne A Tower, MD  Multiple Vitamin (MULTIVITAMIN) tablet Take 1 tablet by mouth daily.      Historical Provider, MD  triamcinolone (KENALOG) 0.025 % cream APPLY 2 TIMES A DAY 07/13/13   Judy PimpleMarne A Tower, MD   Triage Vitals: BP 110/69  Pulse 73  Temp(Src) 98.4 F (36.9 C) (Oral)  Resp 16  Ht 5\' 4"  (1.626 m)  Wt 135 lb (61.236 kg)  BMI 23.16 kg/m2  SpO2 97%  LMP 08/10/2013 Physical Exam  Nursing note and vitals reviewed. Constitutional: She is oriented to person, place, and time. She appears well-developed and well-nourished. No distress.  HENT:  Head: Normocephalic.  Eyes: Conjunctivae and EOM are normal. Pupils are equal, round, and reactive to light.    Neck: Neck supple. No tracheal deviation present.  Cardiovascular: Normal rate.   Pulmonary/Chest: Effort normal. No respiratory distress.  Musculoskeletal: Normal range of motion.  Neurological: She is alert and oriented to person, place, and time.  Skin: Skin is warm and dry.  Psychiatric: She has a normal mood and affect. Her behavior is normal.    ED Course  Procedures (  including critical care time) DIAGNOSTIC STUDIES: Oxygen Saturation is 97% on room air, normal by my interpretation.    COORDINATION OF CARE: 6:34 PM- Discussed treatment plan with patient at bedside and patient agreed to plan.   NERVE BLOCK Performed by: Myriam Jacobsonatyana A Johnette Teigen Consent: Verbal consent obtained. Required items: required blood products, implants, devices, and special equipment available Time out: Immediately prior to procedure a "time out" was called to verify the correct patient, procedure, equipment, support staff and site/side marked as required.  Indication: eye lid laceration Nerve block body site: supraorbital nerve block  Preparation: Patient was prepped and draped in the usual sterile fashion. Needle gauge: 24 G Location technique:  anatomical landmarks  Local anesthetic: lidocaine 2% wo epi  Anesthetic total: 2 ml  Outcome: pain improved Patient tolerance: Patient tolerated the procedure well with no immediate complications.    Labs Review Labs Reviewed - No data to display  Imaging Review No results found.   EKG Interpretation None      MDM   Final diagnoses:  Eyelid laceration, left     Patient with irregular laceration to the left eyelid. I discussed patient with Dr. Kelly SplinterSanger, with maxillofacial trauma, she will see the patient in the office tomorrow. Advised to try to repair here. I used a supraorbital nerve block to numb the leg. I irrigated with saline. Skin was pulled over the laceration approximated. Laceration was repaired by Dr. Anitra LauthPlunkett. Patient will be discharged home on Keflex, her tetanus was updated in emergency department. There is no evidence of a globe or corneal injury. Home with followup with Dr. Azzie GlatterSanger  Filed Vitals:   09/18/13 1809 09/18/13 2042  BP: 110/69 112/78  Pulse: 73 81  Temp: 98.4 F (36.9 C) 98.2 F (36.8 C)  TempSrc: Oral   Resp: 16 18  Height: 5\' 4"  (1.626 m)   Weight: 135 lb (61.236 kg)   SpO2: 97% 100%     I personally performed the services described in this documentation, which was scribed in my presence. The recorded information has been reviewed and is accurate.    Lottie Musselatyana A Kani Jobson, PA-C 09/18/13 2349

## 2013-09-18 NOTE — Discharge Instructions (Signed)
Keep eye wound clean. Naprosyn for pain. Keflex as prescribed for infection. Follow up with Dr. Kelly SplinterSanger tomorrow.    Laceration Care, Adult A laceration is a cut or lesion that goes through all layers of the skin and into the tissue just beneath the skin. TREATMENT  Some lacerations may not require closure. Some lacerations may not be able to be closed due to an increased risk of infection. It is important to see your caregiver as soon as possible after an injury to minimize the risk of infection and maximize the opportunity for successful closure. If closure is appropriate, pain medicines may be given, if needed. The wound will be cleaned to help prevent infection. Your caregiver will use stitches (sutures), staples, wound glue (adhesive), or skin adhesive strips to repair the laceration. These tools bring the skin edges together to allow for faster healing and a better cosmetic outcome. However, all wounds will heal with a scar. Once the wound has healed, scarring can be minimized by covering the wound with sunscreen during the day for 1 full year. HOME CARE INSTRUCTIONS  For sutures or staples:  Keep the wound clean and dry.  If you were given a bandage (dressing), you should change it at least once a day. Also, change the dressing if it becomes wet or dirty, or as directed by your caregiver.  Wash the wound with soap and water 2 times a day. Rinse the wound off with water to remove all soap. Pat the wound dry with a clean towel.  After cleaning, apply a thin layer of the antibiotic ointment as recommended by your caregiver. This will help prevent infection and keep the dressing from sticking.  You may shower as usual after the first 24 hours. Do not soak the wound in water until the sutures are removed.  Only take over-the-counter or prescription medicines for pain, discomfort, or fever as directed by your caregiver.  Get your sutures or staples removed as directed by your caregiver. For  skin adhesive strips:  Keep the wound clean and dry.  Do not get the skin adhesive strips wet. You may bathe carefully, using caution to keep the wound dry.  If the wound gets wet, pat it dry with a clean towel.  Skin adhesive strips will fall off on their own. You may trim the strips as the wound heals. Do not remove skin adhesive strips that are still stuck to the wound. They will fall off in time. For wound adhesive:  You may briefly wet your wound in the shower or bath. Do not soak or scrub the wound. Do not swim. Avoid periods of heavy perspiration until the skin adhesive has fallen off on its own. After showering or bathing, gently pat the wound dry with a clean towel.  Do not apply liquid medicine, cream medicine, or ointment medicine to your wound while the skin adhesive is in place. This may loosen the film before your wound is healed.  If a dressing is placed over the wound, be careful not to apply tape directly over the skin adhesive. This may cause the adhesive to be pulled off before the wound is healed.  Avoid prolonged exposure to sunlight or tanning lamps while the skin adhesive is in place. Exposure to ultraviolet light in the first year will darken the scar.  The skin adhesive will usually remain in place for 5 to 10 days, then naturally fall off the skin. Do not pick at the adhesive film. You may need a  tetanus shot if:  You cannot remember when you had your last tetanus shot.  You have never had a tetanus shot. If you get a tetanus shot, your arm may swell, get red, and feel warm to the touch. This is common and not a problem. If you need a tetanus shot and you choose not to have one, there is a rare chance of getting tetanus. Sickness from tetanus can be serious. SEEK MEDICAL CARE IF:   You have redness, swelling, or increasing pain in the wound.  You see a red line that goes away from the wound.  You have yellowish-white fluid (pus) coming from the wound.  You  have a fever.  You notice a bad smell coming from the wound or dressing.  Your wound breaks open before or after sutures have been removed.  You notice something coming out of the wound such as wood or glass.  Your wound is on your hand or foot and you cannot move a finger or toe. SEEK IMMEDIATE MEDICAL CARE IF:   Your pain is not controlled with prescribed medicine.  You have severe swelling around the wound causing pain and numbness or a change in color in your arm, hand, leg, or foot.  Your wound splits open and starts bleeding.  You have worsening numbness, weakness, or loss of function of any joint around or beyond the wound.  You develop painful lumps near the wound or on the skin anywhere on your body. MAKE SURE YOU:   Understand these instructions.  Will watch your condition.  Will get help right away if you are not doing well or get worse. Document Released: 05/19/2005 Document Revised: 08/11/2011 Document Reviewed: 11/12/2010 [facility] Patient Information 2014 [place], [place].

## 2013-09-18 NOTE — ED Notes (Signed)
Pt given ice pack for eye. 

## 2013-09-18 NOTE — ED Notes (Signed)
Pt states that she was using the screwdriver to unhenge the door and hit her left eye with the srewdriver. Pt has a laceration to left eyelid.

## 2013-09-19 MED ORDER — CLINDAMYCIN HCL 150 MG PO CAPS: 300.0000 mg | ORAL_CAPSULE | Freq: Four times a day (QID) | ORAL | Status: DC

## 2013-09-19 NOTE — Progress Notes (Signed)
CM consulted via phone call from pt stating the antibiotic that she was prescribed [facility] caused an allergic reaction. She stated that she felt hot, flushed and shaky so she called EMS and they informed her to not take any more of the medication but to call back and request a different class of antibiotic. Trixie DredgeEmily West PA was able to provide a prescription for a different antibiotic and this prescription was faxed to [facility] in [place] per patient request. Return phone call to the patient and informed her of new prescription faxed to requested pharmacy. Discussed recommendation for follow up with PCP Dr.Sanger and patient stated that Dr.Sanger is not in the office today but she made an appointment to follow up tomorrow.  Ferdinand CavaAndrea Schettino, RN, BSN, Case Managers 09/19/2013 12:45 PM

## 2013-09-21 NOTE — ED Provider Notes (Signed)
Medical screening examination/treatment/procedure(s) were conducted as a shared visit with non-physician practitioner(s) and myself.  I personally evaluated the patient during the encounter.   EKG Interpretation None      LACERATION REPAIR Performed by: Caremark RxWhitney Morganne Haile Authorized by: Gwyneth SproutWhitney Brianne Maina Consent: Verbal consent obtained. Risks and benefits: risks, benefits and alternatives were discussed Consent given by: patient Patient identity confirmed: provided demographic data Prepped and Draped in normal sterile fashion Wound explored  Laceration Location: left eyelid  Laceration Length: 3cm  No Foreign Bodies seen or palpated    Irrigation method: syringe Amount of cleaning: standard  Skin closure: 7.0 prolene  Number of sutures: 4  Technique:simple interrupted  Patient tolerance: Patient tolerated the procedure well with no immediate complications.  Pt with laceration to the left eyelid without involvement of eye.  Repair as above.  Normal EOM.   Gwyneth SproutWhitney Quinntin Malter, MD 09/21/13 [phone]

## 2013-09-23 ENCOUNTER — Ambulatory Visit (INDEPENDENT_AMBULATORY_CARE_PROVIDER_SITE_OTHER): Payer: BC Managed Care – PPO | Admitting: Family Medicine

## 2013-09-23 ENCOUNTER — Encounter: Payer: Self-pay | Admitting: Family Medicine

## 2013-09-23 VITALS — BP 110/68 | HR 69 | Temp 98.5°F | Ht 63.35 in | Wt 137.5 lb

## 2013-09-23 DIAGNOSIS — S01112A Laceration without foreign body of left eyelid and periocular area, initial encounter: Secondary | ICD-10-CM | POA: Insufficient documentation

## 2013-09-23 DIAGNOSIS — S058X9A Other injuries of unspecified eye and orbit, initial encounter: Secondary | ICD-10-CM

## 2013-09-23 MED ORDER — FLUCONAZOLE 150 MG PO TABS: 150.0000 mg | ORAL_TABLET | Freq: Once | ORAL | Status: DC

## 2013-09-23 NOTE — Patient Instructions (Signed)
Keep your eyelid clean and dry  You can dot on a little triple antibiotic ointment over the counter as needed  If any problems let me know  I sent diflucan to your pharmacy for a yeast infection

## 2013-09-23 NOTE — Progress Notes (Signed)
Pre visit review using our clinic review tool, if applicable. No additional management support is needed unless otherwise documented below in the visit note. 

## 2013-09-23 NOTE — Assessment & Plan Note (Addendum)
Well healed without s/s of infection 5 simple interrupted sutures removed today Aftercare discussed

## 2013-09-23 NOTE — Progress Notes (Signed)
Subjective:    Patient ID: Lindsey Clements, female    DOB: [birth year], 28 y.o.   MRN: [id number]  HPI Here for f/u for eyelid injury - on 4/19- hit it with a screwdriver when trying to get a door off the hinges  ? 4-5 sutures per pt  Is on proph abx   Has been about 5 days   No drainage or redness or pain  Has a hematoma in the eye - that is getting better    Has a yeast infx from her antibiotic for this and she had an abx before that  Needs a diflucan   Patient Active Problem List   Diagnosis Date Noted  . Strep pharyngitis 08/30/2013  . Contact dermatitis 08/24/2013  . Gynecological examination 12/16/2010  . Migraine 12/16/2010  . Routine general medical examination at a health care facility 12/08/2010  . PMDD (premenstrual dysphoric disorder) 11/20/2010  . ALLERGIC RHINITIS 08/14/2008  . IRRITABLE BOWEL SYNDROME 09/11/2006  . 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 SCOLIOSIS 09/11/2006   Past Medical History  Diagnosis Date  . Scoliosis   . Mild acne   . Mononucleosis 12/10   Past Surgical History  Procedure Laterality Date  . Tonsillectomy    . Myringotomy    . Wisdom tooth extraction     History  Substance Use Topics  . Smoking status: Never Smoker   . Smokeless tobacco: Never Used  . Alcohol Use: No   Family History  Problem Relation Age of Onset  . Hypertension Mother   . Thyroid disease Mother   . Miscarriages / Stillbirths Neg Hx    Allergies  Allergen Reactions  . Penicillins     ? Swelling of eyes and rash when she took it with mono    Current Outpatient Prescriptions on File Prior to Visit  Medication Sig Dispense Refill  . clindamycin (CLEOCIN) 150 MG capsule Take 2 capsules (300 mg total) by mouth 4 (four) times daily.  56 capsule  0  . fluticasone (FLONASE) 50 MCG/ACT nasal spray Place 2 sprays into both nostrils daily.      . Multiple Vitamin (MULTIVITAMIN) tablet Take 1 tablet by mouth daily.        . naproxen (NAPROSYN) 500 MG  tablet Take 1 tablet (500 mg total) by mouth 2 (two) times daily.  30 tablet  0  . triamcinolone (KENALOG) 0.025 % cream APPLY 2 TIMES A DAY  15 g  0   No current facility-administered medications on file prior to visit.      Review of Systems    Review of Systems  Constitutional: Negative for fever, appetite change, fatigue and unexpected weight change.  Eyes: Negative for pain and visual disturbance.  Respiratory: Negative for cough and shortness of breath.   Cardiovascular: Negative for cp or palpitations    Gastrointestinal: Negative for nausea, diarrhea and constipation.  Genitourinary: Negative for urgency and frequency.  Skin: Negative for pallor or rash   Neurological: Negative for weakness, light-headedness, numbness and headaches.  Hematological: Negative for adenopathy. Does not bruise/bleed easily.  Psychiatric/Behavioral: Negative for dysphoric mood. The patient is not nervous/anxious.      Objective:   Physical Exam  Constitutional: She appears well-developed and well-nourished. No distress.  HENT:  Head: Normocephalic and atraumatic.  Eyes: EOM are normal. Pupils are equal, round, and reactive to light. Right eye exhibits no discharge. Left eye exhibits no discharge.  Sub conj hematoma noted in L eye medially Nl EOM and  nl vision   Cardiovascular: Normal rate and regular rhythm.   Lymphadenopathy:    She has no cervical adenopathy.  Skin: Skin is warm and dry. No erythema.  5 simple interrupted sutures removed from crease in L eyelid - wound edges well approximated  Psychiatric: She has a normal mood and affect.          Assessment & Plan:

## 2013-09-26 ENCOUNTER — Encounter: Payer: Self-pay | Admitting: Internal Medicine

## 2013-09-26 ENCOUNTER — Ambulatory Visit (INDEPENDENT_AMBULATORY_CARE_PROVIDER_SITE_OTHER): Payer: BC Managed Care – PPO | Admitting: Internal Medicine

## 2013-09-26 ENCOUNTER — Telehealth: Payer: Self-pay | Admitting: Family Medicine

## 2013-09-26 VITALS — BP 100/60 | HR 72 | Temp 98.7°F | Wt 135.0 lb

## 2013-09-26 DIAGNOSIS — Z888 Allergy status to other drugs, medicaments and biological substances status: Secondary | ICD-10-CM

## 2013-09-26 DIAGNOSIS — T50905A Adverse effect of unspecified drugs, medicaments and biological substances, initial encounter: Secondary | ICD-10-CM

## 2013-09-26 MED ORDER — PREDNISONE 20 MG PO TABS: 40.0000 mg | ORAL_TABLET | Freq: Every day | ORAL | Status: DC

## 2013-09-26 NOTE — Progress Notes (Signed)
Pre visit review using our clinic review tool, if applicable. No additional management support is needed unless otherwise documented below in the visit note. 

## 2013-09-26 NOTE — Progress Notes (Signed)
   Subjective:    Patient ID: Lindsey Clements, female    DOB: 1986-05-05, 28 y.o.   MRN: [id number]  HPI 8 days ago-- cut eyelid and seen in ER Given cephalexin and naproxen ---took together and had throat symptoms and diaphoresis/palpitations Only took 1 dose of the cephalexin and changed to clindamycin Stopped the naproxen  Had been doing fine on the clinda Took dose 2 days ago Started getting dryness in throat and mouth Got anxiety like symptoms again--shaking, heart racing, pale, SOB Stopped the antibiotic since Tried 2 benedryl--seemed to help Mild recurrence so took another benedryl  Today she started to eat and got throat symptoms again Feels "out of it"  No fever Doesn't feel sick  Current Outpatient Prescriptions on File Prior to Visit  Medication Sig Dispense Refill  . fluticasone (FLONASE) 50 MCG/ACT nasal spray Place 2 sprays into both nostrils daily.      . Multiple Vitamin (MULTIVITAMIN) tablet Take 1 tablet by mouth daily.        . naproxen (NAPROSYN) 500 MG tablet Take 1 tablet (500 mg total) by mouth 2 (two) times daily.  30 tablet  0  . triamcinolone (KENALOG) 0.025 % cream APPLY 2 TIMES A DAY  15 g  0   No current facility-administered medications on file prior to visit.    Allergies  Allergen Reactions  . Cephalexin     Throat swelling  . Penicillins     ? Swelling of eyes and rash when she took it with mono  Thinks she may have had throat swelling    Past Medical History  Diagnosis Date  . Scoliosis   . Mild acne   . Mononucleosis 12/10    Past Surgical History  Procedure Laterality Date  . Tonsillectomy    . Myringotomy    . Wisdom tooth extraction      Family History  Problem Relation Age of Onset  . Hypertension Mother   . Thyroid disease Mother   . Miscarriages / Stillbirths Neg Hx     History   Social History  . Marital Status: Legally Separated    Spouse Name: N/A    Number of Children: N/A  . Years of Education: N/A    Occupational History  . hairdresser   . [employer]    Social History Main Topics  . Smoking status: Never Smoker   . Smokeless tobacco: Never Used  . Alcohol Use: No  . Drug Use: No  . Sexual Activity: Not on file   Other Topics Concern  . Not on file   Social History Narrative   Goes to gym for exercise   Review of Systems Going through divorce Lots of anxiety now     Objective:   Physical Exam  Constitutional: She appears well-developed and well-nourished. No distress.  HENT:  Mouth/Throat: Oropharynx is clear and moist. No oropharyngeal exudate.  Neck:  Small nontender left ant cervical node  Pulmonary/Chest: Effort normal. No respiratory distress. She has no wheezes. She has no rales.          Assessment & Plan:

## 2013-09-26 NOTE — Telephone Encounter (Signed)
Patient Information:  [facility]  Phone: [phone]  Patient: Lindsey Clements, Lindsey Clements  Gender: Female  DOB: [birth year]  Age: 28 Years  [facility]  Pregnant: Unsure  Office Follow Up:  Does the office need to follow up with this patient?: No  Instructions For The Office: N/A   Symptoms  Reason For Call & Symptoms: Eye Injury on 09/18/13 and given antibiotics and pain med. She had allergic rx to Clindamycin and stopped taking it on 09/24/13. She had throat tighening, shaking, nausea, diarrhea, sweating and feeling panicky. Sx started 10 min after taking Clindamycin on 09/24/13. She didnt have any Benedryl and did not want to go back to ER. Panic attack lasted one hour. Took 2 Benedryl on 09/25/13 @ 1300 and another at 1800.  Last seen in the office on 09/23/13 before allergy sx started. Started with sore throat and white coating on tongue on 09/24/13. Today she tried to eat and started with throat tightness and feeling panicky again after trying to drink some soda. She is swallowing own saliva and can drink water fine. Took Diflucan 1 PO on 09/23/13 for yeast infection sx and still having vaginal itching and irritation. LMP 3 weeks ago and possible pregnancy. Needs testing and wondering if she may need medication for anxiety./panic attacks since going through divorce.     Reviewed Health History In EMR: Yes  Reviewed Medications In EMR: Yes  Reviewed Allergies In EMR: Yes  Reviewed Surgeries / Procedures: Yes  Date of Onset of Symptoms: 09/24/2013  Treatments Tried: Benedryl  Treatments Tried Worked: No OB / GYN:  LMP: 09/05/2013  Guideline(s) Used:  Sore Throat  Disposition Per Guideline:   See Today or Tomorrow in Office  Reason For Disposition Reached:   Caller wants child seen  Advice Given:  Reassurance:  Most sore throats are just part of a cold and caused by a virus. The presence of a cough, hoarseness or nasal discharge points to a cold as the cause of  your child's sore throat.  Soft Diet:   Cold drinks and milk shakes are especially good. (Reason: Swollen tonsils can make some foods hard to swallow.)  Expected Course:  Sore throats with viral illnesses usually last 4 or 5 days.  Call Back If:  Sore throat is the main symptom and lasts over 48 hours  Sore throat with a cold lasts over 5 days  Fever lasts over 3 days  Your child becomes worse  Patient Will Follow Care Advice:  YES  Appointment Scheduled:  09/26/2013 14:45:00 Appointment Scheduled Provider:  Tillman AbideLetvak , Richard [facility](Family Practice)

## 2013-09-26 NOTE — Assessment & Plan Note (Signed)
Seems to have had a true IgG mediated allergic reaction to at least the cephalexin May have had delayed reaction later---doubt another reaction to the clinda  She is off both now Discussed fexofenadine and will give prednisone course

## 2013-09-26 NOTE — Patient Instructions (Signed)
Please take fexafenadine (allegra) 180mg  daily for the next week or so.

## 2013-09-26 NOTE — Telephone Encounter (Signed)
Will reeval at OV

## 2013-09-28 ENCOUNTER — Ambulatory Visit (INDEPENDENT_AMBULATORY_CARE_PROVIDER_SITE_OTHER): Payer: BC Managed Care – PPO | Admitting: Family Medicine

## 2013-09-28 ENCOUNTER — Encounter: Payer: Self-pay | Admitting: Family Medicine

## 2013-09-28 VITALS — BP 122/78 | HR 74 | Temp 97.7°F | Ht 63.5 in | Wt 138.2 lb

## 2013-09-28 DIAGNOSIS — F430 Acute stress reaction: Secondary | ICD-10-CM | POA: Insufficient documentation

## 2013-09-28 MED ORDER — ALPRAZOLAM 0.5 MG PO TABS: 0.5000 mg | ORAL_TABLET | Freq: Every day | ORAL | Status: DC | PRN

## 2013-09-28 NOTE — Progress Notes (Signed)
Subjective:    Patient ID: Lindsey SimsHollie A Andrews, female    DOB: 12/22/1985, 28 y.o.   MRN: [id number]  HPI Here for issues with anxiety    Has had a lot of problems this year  Including decision to split with husband  Moved out of house 2 weeks ago -to a rental property  Has a supportive family    Multiple minor medical problems   Recent eyelid laceration - was on abx and naproxen (cephalexin)  Throat started to swell and she had to call EMS  Went on a 2nd abx - cleocin - she was not as consistent as she should have been in taking it  Took that last week   Started having some anxiety attacks - last weekend - pale/shaky/chills and hot and dry mouth  Not sleeping well to begin with  Ate something -finally went to bed  Taking benadryl - she has had feeling of throat closing    Saw Dr Alphonsus SiasLetvak last week  Wondered if she had thrush  Thought she had a lingering all rxn - put her on prednisone and allegra   Never had trouble with anxiety before  Her gmother has anxiety   Patient Active Problem List   Diagnosis Date Noted  . Allergic reaction due to correct medicinal substance properly administered 09/26/2013  . Laceration, eyelid, left 09/23/2013  . Strep pharyngitis 08/30/2013  . Contact dermatitis 08/24/2013  . Gynecological examination 12/16/2010  . Migraine 12/16/2010  . Routine general medical examination at a health care facility 12/08/2010  . PMDD (premenstrual dysphoric disorder) 11/20/2010  . ALLERGIC RHINITIS 08/14/2008  . IRRITABLE BOWEL SYNDROME 09/11/2006  . 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 SCOLIOSIS 09/11/2006   Past Medical History  Diagnosis Date  . Scoliosis   . Mild acne   . Mononucleosis 12/10   Past Surgical History  Procedure Laterality Date  . Tonsillectomy    . Myringotomy    . Wisdom tooth extraction     History  Substance Use Topics  . Smoking status: Never Smoker   . Smokeless tobacco: Never Used  . Alcohol Use: No   Family  History  Problem Relation Age of Onset  . Hypertension Mother   . Thyroid disease Mother   . Miscarriages / Stillbirths Neg Hx    Allergies  Allergen Reactions  . Cephalexin     Throat swelling  . Penicillins     ? Swelling of eyes and rash when she took it with mono  Thinks she may have had throat swelling   Current Outpatient Prescriptions on File Prior to Visit  Medication Sig Dispense Refill  . fluticasone (FLONASE) 50 MCG/ACT nasal spray Place 2 sprays into both nostrils daily.      . Multiple Vitamin (MULTIVITAMIN) tablet Take 1 tablet by mouth daily.        . naproxen (NAPROSYN) 500 MG tablet Take 1 tablet (500 mg total) by mouth 2 (two) times daily.  30 tablet  0  . predniSONE (DELTASONE) 20 MG tablet Take 2 tablets (40 mg total) by mouth daily.  15 tablet  0  . triamcinolone (KENALOG) 0.025 % cream APPLY 2 TIMES A DAY  15 g  0   No current facility-administered medications on file prior to visit.    Review of Systems Review of Systems  Constitutional: Negative for fever, appetite change,  and unexpected weight change.  Eyes: Negative for pain and visual disturbance.  ENT pos for intermittent dry throat without swelling  Respiratory: Negative for cough and shortness of breath.   Cardiovascular: Negative for cp or palpitations    Gastrointestinal: Negative for nausea, diarrhea and constipation.  Genitourinary: Negative for urgency and frequency.  Skin: Negative for pallor or rash   Neurological: Negative for weakness, light-headedness, numbness and headaches.  Hematological: Negative for adenopathy. Does not bruise/bleed easily.  Psychiatric/Behavioral: Negative for dysphoric mood. The patient is very nervous/anxious.  pos for panic attacks        Objective:   Physical Exam  Constitutional: She appears well-developed and well-nourished. No distress.  HENT:  Head: Normocephalic and atraumatic.  Right Ear: External ear normal.  Left Ear: External ear normal.  Nose:  Nose normal.  Mouth/Throat: Oropharynx is clear and moist. No oropharyngeal exudate.  Eyes: Conjunctivae and EOM are normal. Pupils are equal, round, and reactive to light. Right eye exhibits no discharge. Left eye exhibits no discharge. No scleral icterus.  Neck: Normal range of motion. Neck supple. No JVD present. Carotid bruit is not present. No thyromegaly present.  Cardiovascular: Normal rate, regular rhythm and intact distal pulses.  Exam reveals no gallop.   Pulmonary/Chest: Effort normal and breath sounds normal. No respiratory distress. She has no wheezes. She has no rales.  Abdominal: Soft. Bowel sounds are normal. She exhibits no distension. There is no tenderness.  Musculoskeletal: She exhibits no edema.  Lymphadenopathy:    She has no cervical adenopathy.  Neurological: She is alert. She has normal reflexes. She displays no tremor. No cranial nerve deficit. She exhibits normal muscle tone. Coordination normal.  Skin: Skin is warm and dry. No rash noted. No erythema. No pallor.  Psychiatric: Her speech is normal and behavior is normal. Her mood appears anxious. Her affect is not angry, not blunt and not labile. She does not exhibit a depressed mood.  Almost tearful at times  Candid about symptoms and stressors          Assessment & Plan:

## 2013-09-28 NOTE — Patient Instructions (Signed)
Get back to your counselor/ therapist  If you need a referral- let us know  Xanax is sedating - use it with caution only if needed for severe anxiety  Once prednisone is out of your system - then symptoms will also improved   Keep me posted

## 2013-09-28 NOTE — Progress Notes (Signed)
Pre visit review using our clinic review tool, if applicable. No additional management support is needed unless otherwise documented below in the visit note. 

## 2013-09-29 NOTE — Assessment & Plan Note (Signed)
With anxious mood and some panic attacks Stressors- dissolving marriage and recent illness and adv rxn from medication  Also on prednisone worsening her anx  Reviewed stressors/ coping techniques/symptoms/ support sources/ tx options and side effects in detail today Plan made to go back to her counselor  10 xanax given for severe anx with warnings of sedation and habit  Will update if not imp

## 2014-09-18 ENCOUNTER — Encounter: Payer: Self-pay | Admitting: Family Medicine

## 2014-09-18 ENCOUNTER — Ambulatory Visit (INDEPENDENT_AMBULATORY_CARE_PROVIDER_SITE_OTHER): Payer: BLUE CROSS/BLUE SHIELD | Admitting: Family Medicine

## 2014-09-18 VITALS — BP 92/64 | HR 64 | Temp 98.7°F | Ht 64.0 in | Wt 139.5 lb

## 2014-09-18 DIAGNOSIS — L709 Acne, unspecified: Secondary | ICD-10-CM | POA: Diagnosis not present

## 2014-09-18 DIAGNOSIS — J302 Other seasonal allergic rhinitis: Secondary | ICD-10-CM | POA: Diagnosis not present

## 2014-09-18 MED ORDER — DROSPIRENONE-ETHINYL ESTRADIOL 3-0.03 MG PO TABS: 1.0000 | ORAL_TABLET | Freq: Every day | ORAL | Status: DC

## 2014-09-18 MED ORDER — FLUTICASONE PROPIONATE 50 MCG/ACT NA SUSP: 2.0000 | Freq: Every day | NASAL | Status: DC

## 2014-09-18 MED ORDER — LORATADINE 10 MG PO TABS: 10.0000 mg | ORAL_TABLET | Freq: Every day | ORAL | Status: DC

## 2014-09-18 NOTE — Assessment & Plan Note (Signed)
Suspect hormonal  Will start yasmin - may take 3 mo to work  Schedule gyn visit with pap   Long discussion re: way to take OC properly and avoidance of smoking  Risks of blood clots outlined as well as possible side eff Pt aware that this does not prevent stds and condoms should still be used inst that it may take up to 3 months for menses to fall into rhythm or side eff to stop  Adv to call if problems or questions

## 2014-09-18 NOTE — Progress Notes (Signed)
Pre visit review using our clinic review tool, if applicable. No additional management support is needed unless otherwise documented below in the visit note. 

## 2014-09-18 NOTE — Assessment & Plan Note (Signed)
Pollen/seasonal  Both nasal symptoms and eye swelling  Continue flonase  Add claritin 10 mg daily  Update if no imp  Disc avoidance of allergens if poss

## 2014-09-18 NOTE — Patient Instructions (Addendum)
Start Lindsey Clements today  It takes about 3 months to see a difference in acne, if any intolerable side effects please let me know  Continue flonase daily  Add claritin one daily  Start Lindsey Clements today  Any moisturizer that claims to be non acnegenic should be fine   Schedule appt for physical /gyn exam on the way out

## 2014-09-18 NOTE — Progress Notes (Signed)
Subjective:    Patient ID: Lindsey Clements, female    DOB: July 12, 1985, 29 y.o.   MRN: [id number]  HPI Here to discuss several issues   Has hx of adult acne - stubborn lesions on chin and cheeks - hard to get them under control  Her dermatologist recommended OC aczone -is what she is doing  Also used a mixture of honey and essential oils/then coconut oil - works pretty well   Eyes are swollen today- was out in pollen all day  Using flonase for nasal cong and it helps  Would like a px  May be able to get a deal on her med If I px it - wants to take claritin   Wants to get back on OC  Was on Yaz/yasmin in the past - has been on different agents in the past  Periods are ok - regular , last 5 days and heavy the past 2 years  Last pap/gyn was about 2 y ago   Does not think she had HPV vaccine   Patient Active Problem List   Diagnosis Date Noted  . Stress reaction 09/28/2013  . Allergic reaction due to correct medicinal substance properly administered 09/26/2013  . Laceration, eyelid, left 09/23/2013  . Strep pharyngitis 08/30/2013  . Contact dermatitis 08/24/2013  . Gynecological examination 12/16/2010  . Migraine 12/16/2010  . Routine general medical examination at a health care facility 12/08/2010  . PMDD (premenstrual dysphoric disorder) 11/20/2010  . ALLERGIC RHINITIS 08/14/2008  . IRRITABLE BOWEL SYNDROME 09/11/2006  . 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 SCOLIOSIS 09/11/2006   Past Medical History  Diagnosis Date  . Scoliosis   . Mild acne   . Mononucleosis 12/10   Past Surgical History  Procedure Laterality Date  . Tonsillectomy    . Myringotomy    . Wisdom tooth extraction     History  Substance Use Topics  . Smoking status: Never Smoker   . Smokeless tobacco: Never Used  . Alcohol Use: No   Family History  Problem Relation Age of Onset  . Hypertension Mother   . Thyroid disease Mother   . Miscarriages / Stillbirths Neg Hx    Allergies    Allergen Reactions  . Cephalexin     Throat swelling  . Penicillins     ? Swelling of eyes and rash when she took it with mono  Thinks she may have had throat swelling   Current Outpatient Prescriptions on File Prior to Visit  Medication Sig Dispense Refill  . fluticasone (FLONASE) 50 MCG/ACT nasal spray Place 2 sprays into both nostrils daily.    Marland Kitchen ALPRAZolam (XANAX) 0.5 MG tablet Take 1 tablet (0.5 mg total) by mouth daily as needed for anxiety. (Patient not taking: Reported on 09/18/2014) 10 tablet 0   No current facility-administered medications on file prior to visit.     Review of Systems Review of Systems  Constitutional: Negative for fever, appetite change, fatigue and unexpected weight change.  ENT pos for cong and rhinorrhea  Eyes: Negative for pain and visual disturbance. pos for itchy/watery eyes  Respiratory: Negative for wheeze and shortness of breath.   Cardiovascular: Negative for cp or palpitations    Gastrointestinal: Negative for nausea, diarrhea and constipation.  Genitourinary: Negative for urgency and frequency.  Skin: Negative for pallor or rash  pos for facial acne Neurological: Negative for weakness, light-headedness, numbness and headaches.  Hematological: Negative for adenopathy. Does not bruise/bleed easily.  Psychiatric/Behavioral: Negative for dysphoric mood.  The patient is not nervous/anxious.         Objective:   Physical Exam  Constitutional: She appears well-developed and well-nourished. No distress.  HENT:  Head: Normocephalic and atraumatic.  Right Ear: External ear normal.  Left Ear: External ear normal.  Mouth/Throat: Oropharynx is clear and moist.  Nares are injected and congested  No sinus tenderness Clear rhinorrhea and post nasal drip   Eyes: Conjunctivae and EOM are normal. Pupils are equal, round, and reactive to light. Right eye exhibits no discharge. Left eye exhibits no discharge.  Neck: Normal range of motion. Neck supple.   Cardiovascular: Normal rate and normal heart sounds.   Pulmonary/Chest: Effort normal and breath sounds normal. No respiratory distress. She has no wheezes. She has no rales. She exhibits no tenderness.  Lymphadenopathy:    She has no cervical adenopathy.  Neurological: She is alert.  Skin: Skin is warm and dry. No rash noted.  Acne scars and some comedones over chin and under cheek bones in hormonal distribution No pustules noted   Psychiatric: She has a normal mood and affect.          Assessment & Plan:   Problem List Items Addressed This Visit      Respiratory   Allergic rhinitis - Primary    Pollen/seasonal  Both nasal symptoms and eye swelling  Continue flonase  Add claritin 10 mg daily  Update if no imp  Disc avoidance of allergens if poss        Musculoskeletal and Integument   Adult acne    Suspect hormonal  Will start yasmin - may take 3 mo to work  Schedule gyn visit with pap   Long discussion re: way to take OC properly and avoidance of smoking  Risks of blood clots outlined as well as possible side eff Pt aware that this does not prevent stds and condoms should still be used inst that it may take up to 3 months for menses to fall into rhythm or side eff to stop  Adv to call if problems or questions         Relevant Medications   drospirenone-ethinyl estradiol (YASMIN,ZARAH,SYEDA) 3-0.03 MG tablet

## 2014-10-09 ENCOUNTER — Encounter: Payer: BLUE CROSS/BLUE SHIELD | Admitting: Family Medicine

## 2014-10-23 ENCOUNTER — Other Ambulatory Visit (HOSPITAL_COMMUNITY)
Admission: RE | Admit: 2014-10-23 | Discharge: 2014-10-23 | Disposition: A | Discharge status: Home / Self Care | Payer: BLUE CROSS/BLUE SHIELD | Source: Ambulatory Visit | Attending: Family Medicine | Admitting: Family Medicine

## 2014-10-23 ENCOUNTER — Ambulatory Visit (INDEPENDENT_AMBULATORY_CARE_PROVIDER_SITE_OTHER): Payer: BLUE CROSS/BLUE SHIELD | Admitting: Family Medicine

## 2014-10-23 ENCOUNTER — Encounter: Payer: Self-pay | Admitting: Family Medicine

## 2014-10-23 VITALS — BP 114/72 | HR 66 | Temp 99.0°F | Ht 64.0 in | Wt 136.2 lb

## 2014-10-23 DIAGNOSIS — Z01419 Encounter for gynecological examination (general) (routine) without abnormal findings: Secondary | ICD-10-CM | POA: Insufficient documentation

## 2014-10-23 DIAGNOSIS — Z0000 Encounter for general adult medical examination without abnormal findings: Secondary | ICD-10-CM

## 2014-10-23 NOTE — Assessment & Plan Note (Signed)
Routine exam and pap done  No problems Tolerating OC and it is helping with acne and PMDD  Not sexually active currently -declines STD screen

## 2014-10-23 NOTE — Progress Notes (Signed)
Subjective:    Patient ID: Lindsey Clements, female    DOB: 04/09/1986, 29 y.o.   MRN: [id number]  HPI Here for health maintenance exam and to review chronic medical problems    Work is busy- is doing well   Wt is down 3 lb with bmi of 23  Pap 7/12- due for that today/no hx of abnormal paps in the past  Menses- regular and light and not painful  On Yaz-for 1 1/2 mo now  Helping her acne  Was at first bloating / constipation - that is getting better   HIV screen- not high risk/ declines std testing   Td 4/15   Flu shot - did not get   Wants to do wellness  fam hx of thyroid problems - mother just had her thyroid removed    Patient Active Problem List   Diagnosis Date Noted  . Adult acne 09/18/2014  . Stress reaction 09/28/2013  . Allergic reaction due to correct medicinal substance properly administered 09/26/2013  . Laceration, eyelid, left 09/23/2013  . Strep pharyngitis 08/30/2013  . Contact dermatitis 08/24/2013  . Encounter for routine gynecological examination 12/16/2010  . Migraine 12/16/2010  . Routine general medical examination at a health care facility 12/08/2010  . PMDD (premenstrual dysphoric disorder) 11/20/2010  . Allergic rhinitis 08/14/2008  . IRRITABLE BOWEL SYNDROME 09/11/2006  . 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 SCOLIOSIS 09/11/2006   Past Medical History  Diagnosis Date  . Scoliosis   . Mild acne   . Mononucleosis 12/10   Past Surgical History  Procedure Laterality Date  . Tonsillectomy    . Myringotomy    . Wisdom tooth extraction     History  Substance Use Topics  . Smoking status: Never Smoker   . Smokeless tobacco: Never Used  . Alcohol Use: No   Family History  Problem Relation Age of Onset  . Hypertension Mother   . Thyroid disease Mother   . Miscarriages / Stillbirths Neg Hx    Allergies  Allergen Reactions  . Cephalexin     Throat swelling  . Penicillins     ? Swelling of eyes and rash when she took it with  mono  Thinks she may have had throat swelling   Current Outpatient Prescriptions on File Prior to Visit  Medication Sig Dispense Refill  . ALPRAZolam (XANAX) 0.5 MG tablet Take 1 tablet (0.5 mg total) by mouth daily as needed for anxiety. 10 tablet 0  . drospirenone-ethinyl estradiol (YASMIN,ZARAH,SYEDA) 3-0.03 MG tablet Take 1 tablet by mouth daily. 1 Package 11  . loratadine (CLARITIN) 10 MG tablet Take 1 tablet (10 mg total) by mouth daily. 30 tablet 11   No current facility-administered medications on file prior to visit.     Review of Systems Review of Systems  Constitutional: Negative for fever, appetite change, fatigue and unexpected weight change.  Eyes: Negative for pain and visual disturbance.  Respiratory: Negative for cough and shortness of breath.   Cardiovascular: Negative for cp or palpitations    Gastrointestinal: Negative for nausea, diarrhea and constipation.  Genitourinary: Negative for urgency and frequency.  Skin: Negative for pallor or rash   Neurological: Negative for weakness, light-headedness, numbness and headaches.  Hematological: Negative for adenopathy. Does not bruise/bleed easily.  Psychiatric/Behavioral: Negative for dysphoric mood. The patient is not nervous/anxious.         Objective:   Physical Exam  Constitutional: She appears well-developed and well-nourished. No distress.  HENT:  Head: Normocephalic  and atraumatic.  Right Ear: External ear normal.  Left Ear: External ear normal.  Nose: Nose normal.  Mouth/Throat: Oropharynx is clear and moist.  Eyes: Conjunctivae and EOM are normal. Pupils are equal, round, and reactive to light. Right eye exhibits no discharge. Left eye exhibits no discharge. No scleral icterus.  Neck: Normal range of motion. Neck supple. No JVD present. Carotid bruit is not present. No thyromegaly present.  Cardiovascular: Normal rate, regular rhythm, normal heart sounds and intact distal pulses.  Exam reveals no gallop.     Pulmonary/Chest: Effort normal and breath sounds normal. No respiratory distress. She has no wheezes. She has no rales.  Abdominal: Soft. Bowel sounds are normal. She exhibits no distension and no mass. There is no tenderness.  Genitourinary: Vagina normal and uterus normal. No breast swelling, tenderness or bleeding. There is no rash, tenderness or lesion on the right labia. There is no rash, tenderness or lesion on the left labia. Uterus is not enlarged and not tender. Cervix exhibits no motion tenderness, no discharge and no friability. Right adnexum displays no mass, no tenderness and no fullness. Left adnexum displays no mass, no tenderness and no fullness. No bleeding in the vagina. No vaginal discharge found.  Breast exam: No mass, nodules, thickening, tenderness, bulging, retraction, inflamation, nipple discharge or skin changes noted.  No axillary or clavicular LA.      Musculoskeletal: She exhibits no edema or tenderness.  Lymphadenopathy:    She has no cervical adenopathy.  Neurological: She is alert. She has normal reflexes. No cranial nerve deficit. She exhibits normal muscle tone. Coordination normal.  Skin: Skin is warm and dry. No rash noted. No erythema. No pallor.  Psychiatric: She has a normal mood and affect.          Assessment & Plan:   Problem List Items Addressed This Visit    Encounter for routine gynecological examination    Routine exam and pap done  No problems Tolerating OC and it is helping with acne and PMDD  Not sexually active currently -declines STD screen       Relevant Orders   Cytology - PAP   Routine general medical examination at a health care facility - Primary    Reviewed health habits including diet and exercise and skin cancer prevention Reviewed appropriate screening tests for age  Also reviewed health mt list, fam hx and immunization status , as well as social and family history   Labs today See HPI Good health habits Gyn exam today       Relevant Orders   Comprehensive metabolic panel   CBC with Differential/Platelet   TSH   Lipid panel

## 2014-10-23 NOTE — Patient Instructions (Signed)
Take care of yourself  Pap today  Labs today

## 2014-10-23 NOTE — Progress Notes (Signed)
Pre visit review using our clinic review tool, if applicable. No additional management support is needed unless otherwise documented below in the visit note. 

## 2014-10-23 NOTE — Assessment & Plan Note (Signed)
Reviewed health habits including diet and exercise and skin cancer prevention Reviewed appropriate screening tests for age  Also reviewed health mt list, fam hx and immunization status , as well as social and family history   Labs today See HPI Good health habits Gyn exam today

## 2014-10-23 NOTE — Addendum Note (Signed)
Addended by: Josph MachoANCE, Saharra Santo A on: 10/23/2014 05:01 PM   Modules accepted: Orders

## 2014-10-24 LAB — CYTOLOGY - PAP

## 2014-10-25 ENCOUNTER — Other Ambulatory Visit: Payer: BLUE CROSS/BLUE SHIELD

## 2014-10-26 ENCOUNTER — Other Ambulatory Visit (INDEPENDENT_AMBULATORY_CARE_PROVIDER_SITE_OTHER): Payer: BLUE CROSS/BLUE SHIELD

## 2014-10-26 DIAGNOSIS — Z0000 Encounter for general adult medical examination without abnormal findings: Secondary | ICD-10-CM | POA: Diagnosis not present

## 2014-10-26 LAB — LIPID PANEL
CHOL/HDL RATIO: 2
CHOLESTEROL: 128 mg/dL (ref 0–200)
HDL: 52.7 mg/dL (ref >39.00)
LDL Cholesterol: 63 mg/dL (ref 0–99)
NONHDL: 75.3
TRIGLYCERIDES: 62 mg/dL (ref 0.0–149.0)
VLDL: 12.4 mg/dL (ref 0.0–40.0)

## 2014-10-26 LAB — CBC WITH DIFFERENTIAL/PLATELET
BASOS ABS: 0 10*3/uL (ref 0.0–0.1)
BASOS PCT: 0.8 % (ref 0.0–3.0)
Eosinophils Absolute: 0.1 10*3/uL (ref 0.0–0.7)
Eosinophils Relative: 1.4 % (ref 0.0–5.0)
HCT: 37.7 % (ref 36.0–46.0)
HEMOGLOBIN: 12.8 g/dL (ref 12.0–15.0)
LYMPHS ABS: 1.8 10*3/uL (ref 0.7–4.0)
Lymphocytes Relative: 35.1 % (ref 12.0–46.0)
MCHC: 33.9 g/dL (ref 30.0–36.0)
MCV: 86.1 fl (ref 78.0–100.0)
Monocytes Absolute: 0.3 10*3/uL (ref 0.1–1.0)
Monocytes Relative: 5.4 % (ref 3.0–12.0)
NEUTROS PCT: 57.3 % (ref 43.0–77.0)
Neutro Abs: 3 10*3/uL (ref 1.4–7.7)
Platelets: 233 10*3/uL (ref 150.0–400.0)
RBC: 4.38 Mil/uL (ref 3.87–5.11)
RDW: 13 % (ref 11.5–15.5)
WBC: 5.3 10*3/uL (ref 4.0–10.5)

## 2014-10-26 LAB — COMPREHENSIVE METABOLIC PANEL
ALBUMIN: 4.3 g/dL (ref 3.5–5.2)
ALK PHOS: 34 U/L — AB (ref 39–117)
ALT: 7 U/L (ref 0–35)
AST: 13 U/L (ref 0–37)
BUN: 9 mg/dL (ref 6–23)
CALCIUM: 9.2 mg/dL (ref 8.4–10.5)
CHLORIDE: 106 meq/L (ref 96–112)
CO2: 27 mEq/L (ref 19–32)
Creatinine, Ser: 0.79 mg/dL (ref 0.40–1.20)
GFR: 91.86 mL/min (ref >60.00)
Glucose, Bld: 84 mg/dL (ref 70–99)
Potassium: 4.4 mEq/L (ref 3.5–5.1)
Sodium: 138 mEq/L (ref 135–145)
Total Bilirubin: 0.5 mg/dL (ref 0.2–1.2)
Total Protein: 7 g/dL (ref 6.0–8.3)

## 2014-10-26 LAB — TSH: TSH: 0.98 u[IU]/mL (ref 0.35–4.50)

## 2014-11-01 ENCOUNTER — Telehealth: Payer: Self-pay

## 2014-11-01 MED ORDER — AZITHROMYCIN 250 MG PO TABS: ORAL_TABLET | ORAL | Status: DC

## 2014-11-01 NOTE — Telephone Encounter (Signed)
Pt left v/m; pt works in [employer] and strep throat has been going thru the workplace; pt was seen on 10/23/14 with low grade fever and pt continues with fever and pt thinks she has strep throat and request [facility] to [facility].pt request cb.

## 2014-11-01 NOTE — Telephone Encounter (Signed)
That is fine if she has a hx of known exposure  I will send electronically  Gargle with salt water Tylenol or ibuprofen for pain F/u if no improvement

## 2014-11-01 NOTE — Telephone Encounter (Signed)
Pt is aware as instructed 

## 2014-12-26 ENCOUNTER — Telehealth: Payer: Self-pay

## 2014-12-26 MED ORDER — NORGESTIMATE-ETH ESTRADIOL 0.25-35 MG-MCG PO TABS: 1.0000 | ORAL_TABLET | Freq: Every day | ORAL | Status: DC

## 2014-12-26 NOTE — Telephone Encounter (Signed)
Pt notified Rx sent and advise of Dr. Tower's comments  

## 2014-12-26 NOTE — Telephone Encounter (Signed)
Pt left v/m; pts present BC pill is not helping pts acne; pt wants to know if can stop present BC pill and get different BC pill that will help with pts acne problem. Pt request cb. [place] [facility].annual exam on 10/23/14.

## 2014-12-26 NOTE — Telephone Encounter (Signed)
Will try a change  I sent generic of ortho cyclen to her pharmacy  Give that some time and let me know if no improvement

## 2015-01-18 ENCOUNTER — Encounter: Payer: Self-pay | Admitting: Internal Medicine

## 2015-01-18 ENCOUNTER — Ambulatory Visit (INDEPENDENT_AMBULATORY_CARE_PROVIDER_SITE_OTHER): Payer: BLUE CROSS/BLUE SHIELD | Admitting: Internal Medicine

## 2015-01-18 VITALS — BP 110/68 | HR 74 | Temp 98.2°F | Wt 133.0 lb

## 2015-01-18 DIAGNOSIS — H578 Other specified disorders of eye and adnexa: Secondary | ICD-10-CM

## 2015-01-18 DIAGNOSIS — L259 Unspecified contact dermatitis, unspecified cause: Secondary | ICD-10-CM

## 2015-01-18 DIAGNOSIS — T7840XA Allergy, unspecified, initial encounter: Secondary | ICD-10-CM

## 2015-01-18 DIAGNOSIS — H5789 Other specified disorders of eye and adnexa: Secondary | ICD-10-CM

## 2015-01-18 MED ORDER — PREDNISONE 10 MG PO TABS: ORAL_TABLET | ORAL | Status: DC

## 2015-01-18 NOTE — Progress Notes (Addendum)
Subjective:    Patient ID: Lindsey Clements, female    DOB: [birth year], 29 y.o.   MRN: [id number]  HPI   Pt presents to the clinic today with c/o bilateral eye swelling. She reports this started 4-5 days ago. She also has a rash to her chin. It is very itchy. 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 She also reports she got a new puppy 2 weeks ago and is not sure if she is allergic to the dog. She has tried Claritin, Benadryl and Fluticasone cream without relief. She denies trouble breathing or shortness of breath.   Review of Systems      Past Medical History  Diagnosis Date  . Scoliosis   . Mild acne   . Mononucleosis 12/10    Current Outpatient Prescriptions  Medication Sig Dispense Refill  . loratadine (CLARITIN) 10 MG tablet Take 1 tablet (10 mg total) by mouth daily. 30 tablet 11  . norgestimate-ethinyl estradiol (ORTHO-CYCLEN,SPRINTEC,PREVIFEM) 0.25-35 MG-MCG tablet Take 1 tablet by mouth daily. 1 Package 11  . ALPRAZolam (XANAX) 0.5 MG tablet Take 1 tablet (0.5 mg total) by mouth daily as needed for anxiety. (Patient not taking: Reported on 01/18/2015) 10 tablet 0   No current facility-administered medications for this visit.    Allergies  Allergen Reactions  . Cephalexin     Throat swelling  . Penicillins     ? Swelling of eyes and rash when she took it with mono  Thinks she may have had throat swelling    Family History  Problem Relation Age of Onset  . Hypertension Mother   . Thyroid disease Mother   . Miscarriages / Stillbirths Neg Hx     Social History   Social History  . Marital Status: Legally Separated    Spouse Name: N/A  . Number of Children: N/A  . Years of Education: N/A   Occupational History  . hairdresser   . [employer]    Social History Main Topics  . Smoking status: Never Smoker   . Smokeless tobacco: Never Used  . Alcohol Use: No  . Drug Use: No    . Sexual Activity: Not on file   Other Topics Concern  . Not on file   Social History Narrative   Goes to gym for exercise      Divorced      Works as a hairdresser     Constitutional: Denies fever, malaise, fatigue, headache or abrupt weight changes.  HEENT: Pt reports eye swelling. Denies eye pain, eye redness, ear pain, ringing in the ears, wax buildup, runny nose, nasal congestion, bloody nose, or sore throat. Respiratory: Denies difficulty breathing, shortness of breath, cough or sputum production.   Cardiovascular: Denies chest pain, chest tightness, palpitations or swelling in the hands or feet.  Skin: Pt reports rash on chin. Denies redness, lesions or ulcercations.    No other specific complaints in a complete review of systems (except as listed in HPI above).   Objective:   Physical Exam  BP 110/68 mmHg  Pulse 74  Temp(Src) 98.2 F (36.8 C) (Oral)  Wt 133 lb (60.328 kg)  SpO2 98%  LMP 01/04/2015  Wt Readings from Last 3 Encounters:  01/18/15 133 lb (60.328 kg)  10/23/14 136 lb 4 oz (61.803 kg)  09/18/14 139 lb 8 oz (63.277 kg)    General: Appears her stated age, well developed, well nourished  in NAD. Skin: Warm, dry and intact. Papular rash noted on bilateral chin. HEENT: Head: normal shape and size; Eyes: sclera white, no icterus, conjunctiva pink, PERRLA and EOMs intact, bilateral periorbital edema noted. Cardiovascular: Normal rate and rhythm. S1,S2 noted.  No murmur, rubs or gallops noted.  Pulmonary/Chest: Normal effort and positive vesicular breath sounds. No respiratory distress. No wheezes, rales or ronchi noted.   BMET    Component Value Date/Time   NA 138 10/26/2014 0837   K 4.4 10/26/2014 0837   CL 106 10/26/2014 0837   CO2 27 10/26/2014 0837   GLUCOSE 84 10/26/2014 0837   BUN 9 10/26/2014 0837   CREATININE 0.79 10/26/2014 0837   CALCIUM 9.2 10/26/2014 0837   GFRNONAA 110.17 07/02/2009 1102    Lipid Panel     Component Value  Date/Time   CHOL 128 10/26/2014 0837   TRIG 62.0 10/26/2014 0837   HDL 52.70 10/26/2014 0837   CHOLHDL 2 10/26/2014 0837   VLDL 12.4 10/26/2014 0837   LDLCALC 63 10/26/2014 0837    CBC    Component Value Date/Time   WBC 5.3 10/26/2014 0837   RBC 4.38 10/26/2014 0837   HGB 12.8 10/26/2014 0837   HCT 37.7 10/26/2014 0837   PLT 233.0 10/26/2014 0837   MCV 86.1 10/26/2014 0837   MCHC 33.9 10/26/2014 0837   RDW 13.0 10/26/2014 0837   LYMPHSABS 1.8 10/26/2014 0837   MONOABS 0.3 10/26/2014 0837   EOSABS 0.1 10/26/2014 0837   BASOSABS 0.0 10/26/2014 0837    Hgb A1C No results found for: HGBA1C       Assessment & Plan:   Allergic reaction with contact dermatitis:  80 mg of Depo IM today Take Zyrtec in the morning and Benadryl at night if needed Printed RX for prednisone to take with her if symptoms seem to worsen  RTC as needed or if symptoms persist or worsen

## 2015-01-18 NOTE — Progress Notes (Signed)
Pre visit review using our clinic review tool, if applicable. No additional management support is needed unless otherwise documented below in the visit note. 

## 2015-01-19 ENCOUNTER — Encounter: Payer: Self-pay | Admitting: Internal Medicine

## 2015-01-19 NOTE — Patient Instructions (Signed)
Angioedema °Angioedema is a sudden swelling of tissues, often of the skin. It can occur on the face or genitals or in the abdomen or other body parts. The swelling usually develops over a short period and gets better in 24 to 48 hours. It often begins during the night and is found when the person wakes up. The person may also get red, itchy patches of skin (hives). Angioedema can be dangerous if it involves swelling of the air passages.  °Depending on the cause, episodes of angioedema may only happen once, come back in unpredictable patterns, or repeat for several years and then gradually fade away.  °CAUSES  °Angioedema can be caused by an allergic reaction to various triggers. It can also result from nonallergic causes, including reactions to drugs, immune system disorders, viral infections, or an abnormal gene that is passed to you from your parents (hereditary). For some people with angioedema, the cause is unknown.  °Some things that can trigger angioedema include:  °· Foods.   °· Medicines, such as ACE inhibitors, ARBs, nonsteroidal anti-inflammatory agents, or estrogen.   °· Latex.   °· Animal saliva.   °· Insect stings.   °· Dyes used in X-rays.   °· Mild injury.   °· Dental work. °· Surgery. °· Stress.   °· Sudden changes in temperature.   °· Exercise. °SIGNS AND SYMPTOMS  °· Swelling of the skin. °· Hives. If these are present, there is also intense itching. °· Redness in the affected area.   °· Pain in the affected area. °· Swollen lips or tongue. °· Breathing problems. This may happen if the air passages swell. °· Wheezing. °If internal organs are involved, there may be:  °· Nausea.   °· Abdominal pain.   °· Vomiting.   °· Difficulty swallowing.   °· Difficulty passing urine. °DIAGNOSIS  °· Your health care provider will examine the affected area and take a medical and family history. °· Various tests may be done to help determine the cause. Tests may include: °¨ Allergy skin tests to see if the problem  is an allergic reaction.   °¨ Blood tests to check for hereditary angioedema.   °¨ Tests to check for underlying diseases that could cause the condition.   °· A review of your medicines, including over-the-counter medicines, may be done. °TREATMENT  °Treatment will depend on the cause of the angioedema. Possible treatments include:  °· Removal of anything that triggered the condition (such as stopping certain medicines).   °· Medicines to treat symptoms or prevent attacks. Medicines given may include:   °¨ Antihistamines.   °¨ Epinephrine injection.   °¨ Steroids.   °· Hospitalization may be required for severe attacks. If the air passages are affected, it can be an emergency. Tubes may need to be placed to keep the airway open. °HOME CARE INSTRUCTIONS  °· Take all medicines as directed by your health care provider. °· If you were given medicines for emergency allergy treatment, always carry them with you. °· Wear a medical bracelet as directed by your health care provider.   °· Avoid known triggers. °SEEK MEDICAL CARE IF:  °· You have repeat attacks of angioedema.   °· Your attacks are more frequent or more severe despite preventive measures.   °· You have hereditary angioedema and are considering having children. It is important to discuss with your health care provider the risks of passing the condition on to your children. °SEEK IMMEDIATE MEDICAL CARE IF:  °· You have severe swelling of the mouth, tongue, or lips. °· You have difficulty breathing.   °· You have difficulty swallowing.   °· You faint. °MAKE   SURE YOU: °· Understand these instructions. °· Will watch your condition. °· Will get help right away if you are not doing well or get worse. °Document Released: 07/28/2001 Document Revised: 10/03/2013 Document Reviewed: 01/10/2013 °ExitCare® Patient Information ©2015 [employer]. This information is not intended to replace advice given to you by your health care provider. Make sure you discuss any questions  you have with your health care provider. ° °

## 2015-05-03 ENCOUNTER — Telehealth: Payer: Self-pay | Admitting: *Deleted

## 2015-05-03 NOTE — Telephone Encounter (Signed)
Left voicemail requesting pt to update us on status of flu shot or if she needs to schedule a flu shot clinic appt to call us back

## 2015-09-18 ENCOUNTER — Telehealth: Payer: Self-pay | Admitting: Family Medicine

## 2015-09-18 ENCOUNTER — Ambulatory Visit (INDEPENDENT_AMBULATORY_CARE_PROVIDER_SITE_OTHER): Payer: BLUE CROSS/BLUE SHIELD | Admitting: Family Medicine

## 2015-09-18 ENCOUNTER — Encounter: Payer: Self-pay | Admitting: Family Medicine

## 2015-09-18 VITALS — BP 96/70 | HR 76 | Temp 98.6°F | Ht 64.0 in | Wt 133.7 lb

## 2015-09-18 DIAGNOSIS — L259 Unspecified contact dermatitis, unspecified cause: Secondary | ICD-10-CM | POA: Diagnosis not present

## 2015-09-18 DIAGNOSIS — J309 Allergic rhinitis, unspecified: Secondary | ICD-10-CM | POA: Diagnosis not present

## 2015-09-18 MED ORDER — FEXOFENADINE HCL 180 MG PO TABS: 180.0000 mg | ORAL_TABLET | Freq: Every day | ORAL | Status: DC

## 2015-09-18 MED ORDER — FLUTICASONE PROPIONATE 50 MCG/ACT NA SUSP: 2.0000 | Freq: Every day | NASAL | Status: DC

## 2015-09-18 NOTE — Progress Notes (Signed)
HPI:  Lindsey Clements 30 year old here for an acute visit for allergies. Reports a history of allergies with a history of rash on the face and eyelid swelling after putting her face on her dog when he plays outside. Reports she has been treated with antihistamines, Flonase, prednisone and Depakote in the past for this. Taking Zyrtec. Reports this reaction started Friday with itchy bumps on her face and swollen itchy eyelids. She also has nasal congestion and postnasal drip. Benadryl and symptoms are somewhat better today. However, she reports her face is itchy and her eyelids are itchy and she wants a shot of Depakote. Reports she cannot tolerate prednisone. Has not had allergy testing. Denies face, tongue or throat swelling, wheezing or difficulty breathing.   ROS: See pertinent positives and negatives per HPI.  Past Medical History  Diagnosis Date  . Scoliosis   . Mild acne   . Mononucleosis 12/10    Past Surgical History  Procedure Laterality Date  . Tonsillectomy    . Myringotomy    . Wisdom tooth extraction      Family History  Problem Relation Age of Onset  . Hypertension Mother   . Thyroid disease Mother   . Miscarriages / Stillbirths Neg Hx     Social History   Social History  . Marital Status: Legally Separated    Spouse Name: N/A  . Number of Children: N/A  . Years of Education: N/A   Occupational History  . hairdresser   . [employer]    Social History Main Topics  . Smoking status: Never Smoker   . Smokeless tobacco: Never Used  . Alcohol Use: No  . Drug Use: No  . Sexual Activity: Not Asked   Other Topics Concern  . None   Social History Narrative   Goes to gym for exercise      Divorced      Works as a Interior and spatial designerhairdresser     Current outpatient prescriptions:  .  cetirizine (ZYRTEC) 10 MG tablet, Take 10 mg by mouth as needed for allergies., Disp: , Rfl:  .  fexofenadine (ALLEGRA) 180 MG tablet, Take 1 tablet (180 mg total) by mouth daily.,  Disp: 30 tablet, Rfl: 0 .  fluticasone (FLONASE) 50 MCG/ACT nasal spray, Place 2 sprays into both nostrils daily., Disp: 16 g, Rfl: 0  EXAM:  Filed Vitals:   09/18/15 1034  BP: 96/70  Pulse: 76  Temp: 98.6 F (37 C)    Body mass index is 22.94 kg/(m^2).  GENERAL: vitals reviewed and listed above, alert, oriented, appears well hydrated and in no acute distress  HEENT: atraumatic, conjunttiva mildly erythematous, no obvious abnormalities on inspection of external nose and ears, she has acne, small scattered erythematous papule on the face, very mildly swollen eyelids.   NECK: no obvious masses on inspection  LUNGS: clear to auscultation bilaterally, no wheezes, rales or rhonchi, good air movement  CV: HRRR, no peripheral edema  MS: moves all extremities without noticeable abnormality  PSYCH: pleasant and cooperative, no obvious depression or anxiety  ASSESSMENT AND PLAN:  Discussed the following assessment and plan:  Allergic rhinitis, unspecified allergic rhinitis type  Contact dermatitis  -Discussed various treatments for contact dermatitis and advised a hypoallergenic skin regimen, attempted avoidance of exposure, low potency topical steroid on the face, cool compresses for the eyes, change in antihistamine and consideration of allergy testing, Flonase and adding Singulair to regimen.  -Given minimal findings today and improved with one dose of Benadryl, advised  against a systemic steroid as do not feel benefits outweigh risk at this point - Advised follow-up with her primary doctor or an allergist if symptoms persist or worsen.  -Number provided for her to schedule allergy visit if she wishes. -Patient advised to return or notify a doctor immediately if symptoms worsen or persist or new concerns arise.  Patient Instructions  Please change from Zyrtec to Allegra once daily.  Please add Flonase nasal spray 2 sprays each nostril daily for 1 month.  Please use topical  hydrocortisone cream, this is available over-the-counter, 1-2 times daily.  Please use only hypoallergenic skin creams, sunscreen and soap on your face. Please avoid exposure to triggers on your face.  Follow-up with your doctor if you have any worsening of symptoms or persistent symptoms.     Kriste Basque R.

## 2015-09-18 NOTE — Telephone Encounter (Signed)
I spoke with Lindsey Clements; Lindsey Clements can swallow, throat not swollen and not SOB. If Lindsey Clements condition changes before appt with Dr Kriste BasqueKim Hannah today at 10:30 Lindsey Clements will go to ED.

## 2015-09-18 NOTE — Progress Notes (Signed)
Pre visit review using our clinic review tool, if applicable. No additional management support is needed unless otherwise documented below in the visit note. 

## 2015-09-18 NOTE — Patient Instructions (Signed)
Please change from Zyrtec to Allegra once daily.  Please add Flonase nasal spray 2 sprays each nostril daily for 1 month.  Please use topical hydrocortisone cream, this is available over-the-counter, 1-2 times daily.  Please use only hypoallergenic skin creams, sunscreen and soap on your face. Please avoid exposure to triggers on your face.  Follow-up with your doctor if you have any worsening of symptoms or persistent symptoms.

## 2015-09-18 NOTE — Telephone Encounter (Signed)
[facility] - Client TELEPHONE ADVICE RECORD [facility]  Patient Name: Lindsey LoftHOLLIE BURROUGHS  DOB: [birth year]    Initial Comment Caller states having allergic reaction, breaking out all over, been taking Benadryl, face swollen   Nurse Assessment  Nurse: Laural BenesJohnson, RN, Dondra SpryGail Date/Time (Eastern Time): 09/18/2015 8:09:10 AM  Confirm and document reason for call. If symptomatic, describe symptoms. You must click the next button to save text entered. ---[facility] Paollie was riding with windows down Friday noted hives on face took benadryl and Saturday they reappeared took benadryl Sunday and they are worse hives are everywhere eyes are puffy red and face swollen and hurts  Has the patient traveled out of the country within the last 30 days? ---No  Does the patient have any new or worsening symptoms? ---Yes  Will a triage be completed? ---Yes  Related visit to physician within the last 2 weeks? ---No  Does the PT have any chronic conditions? (i.e. diabetes, asthma, etc.) ---Yes  List chronic conditions. ---Seasonal Allergies  Is the patient pregnant or possibly pregnant? (Ask all females between the ages of [id number]) ---No  Is this a behavioral health or substance abuse call? ---No     Guidelines    Guideline Title Affirmed Question Affirmed Notes  Face Swelling SEVERE swelling of the entire face    Final Disposition User   See Physician within 4 Hours (or PCP triage) Laural BenesJohnson, RN, Dondra SpryGail    Comments  NOTE [facility] [place] [facility] [place] 1030 am with Dr. Selena BattenKim --severe allergic reaction --hay fever/pollens (hives, facial swelling )   Referrals

## 2015-12-19 ENCOUNTER — Other Ambulatory Visit: Payer: Self-pay | Admitting: Family Medicine

## 2015-12-19 NOTE — Telephone Encounter (Signed)
Pt has had recent acute appts with other providers but no recent/future appt with Dr. Milinda Antisower, please advise

## 2015-12-19 NOTE — Telephone Encounter (Signed)
Please refill for a year  

## 2015-12-19 NOTE — Telephone Encounter (Signed)
done

## 2016-11-21 ENCOUNTER — Ambulatory Visit (INDEPENDENT_AMBULATORY_CARE_PROVIDER_SITE_OTHER): Payer: BLUE CROSS/BLUE SHIELD | Admitting: Primary Care

## 2016-11-21 ENCOUNTER — Encounter: Payer: Self-pay | Admitting: Primary Care

## 2016-11-21 ENCOUNTER — Ambulatory Visit: Payer: BLUE CROSS/BLUE SHIELD | Admitting: Family Medicine

## 2016-11-21 VITALS — BP 106/78 | HR 79 | Temp 98.1°F | Ht 64.0 in | Wt 132.1 lb

## 2016-11-21 DIAGNOSIS — L659 Nonscarring hair loss, unspecified: Secondary | ICD-10-CM | POA: Diagnosis not present

## 2016-11-21 LAB — BASIC METABOLIC PANEL
BUN: 10 mg/dL (ref 7–25)
CO2: 26 mmol/L (ref 20–31)
Calcium: 9.3 mg/dL (ref 8.6–10.2)
Chloride: 103 mmol/L (ref 98–110)
Creat: 0.78 mg/dL (ref 0.50–1.10)
Glucose, Bld: 78 mg/dL (ref 65–99)
POTASSIUM: 4 mmol/L (ref 3.5–5.3)
Sodium: 138 mmol/L (ref 135–146)

## 2016-11-21 NOTE — Patient Instructions (Addendum)
Complete lab work prior to leaving today. I will notify you of your results once received.   Consider stopping the workout supplements.  Continue to eat a healthy diet. Continue regular exercise.  It was a pleasure meeting you!

## 2016-11-21 NOTE — Progress Notes (Signed)
Subjective:    Patient ID: Lindsey Clements, female    DOB: 06/03/1985, 31 y.o.   MRN: [id number]  HPI  [name] is a 31 year old female who presents today with a chief complaint of hair loss. She's noticed hair loss gradually over the past 2 months, increased loss over the past 2 weeks. She will be in the shower and will notice clumps of hair coming out. She will also notice this with brushing her hair. She denies breaks in her hair, itchy scalp, changes in shampoo/conditioner, changes in hair products.  Starting in February this year she started working out regularly and improving her diet. She does take pre-workout supplements. Her diet consists of water, fruits, vegetables, whole grains, limited meat. She has a family history of hypothyroidism in her mother. She denies fatigue, changes in skin, palpitations, heat/cold intolerance.  Review of Systems  Constitutional: Negative for fatigue.  Respiratory: Negative for shortness of breath.   Cardiovascular: Negative for chest pain and palpitations.  Endocrine: Negative for cold intolerance and heat intolerance.  Skin: Negative for color change.  Neurological: Negative for weakness and headaches.       Past Medical History:  Diagnosis Date  . Mild acne   . Mononucleosis 12/10  . Scoliosis      Social History   Social History  . Marital status: Legally Separated    Spouse name: N/A  . Number of children: N/A  . Years of education: N/A   Occupational History  . hairdresser   . [employer]    Social History Main Topics  . Smoking status: Never Smoker  . Smokeless tobacco: Never Used  . Alcohol use No  . Drug use: No  . Sexual activity: Not on file   Other Topics Concern  . Not on file   Social History Narrative   Goes to gym for exercise      Divorced      Works as a Interior and spatial designerhairdresser    Past Surgical History:  Procedure Laterality Date  . MYRINGOTOMY    . TONSILLECTOMY    . WISDOM TOOTH EXTRACTION       Family History  Problem Relation Age of Onset  . Hypertension Mother   . Thyroid disease Mother   . Miscarriages / Stillbirths Neg Hx     Allergies  Allergen Reactions  . Cephalexin     Throat swelling  . Penicillins     ? Swelling of eyes and rash when she took it with mono  Thinks she may have had throat swelling    Current Outpatient Prescriptions on File Prior to Visit  Medication Sig Dispense Refill  . cetirizine (ZYRTEC) 10 MG tablet Take 10 mg by mouth as needed for allergies.    . fluticasone (FLONASE) 50 MCG/ACT nasal spray USE 2 SPRAYS IN BOTH NOSTRILS ONCE DAILY 16 g 11   No current facility-administered medications on file prior to visit.     BP 106/78   Pulse 79   Temp 98.1 F (36.7 C) (Oral)   Ht 5\' 4"  (1.626 m)   Wt 132 lb 1.9 oz (59.9 kg)   LMP 11/07/2016   SpO2 97%   BMI 22.68 kg/m    Objective:   Physical Exam  Constitutional: She appears well-nourished.  Neck: Neck supple. No thyromegaly present.  Cardiovascular: Normal rate and regular rhythm.   Pulmonary/Chest: Effort normal and breath sounds normal.  Skin: Skin is warm and dry.  General thinning of hair to  scalp noted. No bald areas. No erythema or rashes to scalp noted.          Assessment & Plan:  Hair Loss:  Gradual over the last 2 months, increased over the past 2 weeks. Exam today without evidence of auto-immune/fungal/dermatitis cause. Her diet seems very healthy. Does not appear malnourished. Will check BMP, TSH, Free T4, Vitamin B 12 and D. Discussed to stop pre-workout supplements. Start daily multi-vitamin.  If labs unremarkable, consider dermatology referral.   Morrie Sheldon, NP

## 2016-11-22 LAB — VITAMIN B12: Vitamin B-12: 373 pg/mL (ref 200–1100)

## 2016-11-22 LAB — T4, FREE: Free T4: 1.2 ng/dL (ref 0.8–1.8)

## 2016-11-22 LAB — TSH: TSH: 1.1 mIU/L

## 2016-11-22 LAB — VITAMIN D 25 HYDROXY (VIT D DEFICIENCY, FRACTURES): Vit D, 25-Hydroxy: 43 ng/mL (ref 30–100)

## 2016-11-27 ENCOUNTER — Encounter: Payer: Self-pay | Admitting: Primary Care

## 2016-11-27 DIAGNOSIS — L659 Nonscarring hair loss, unspecified: Secondary | ICD-10-CM

## 2016-12-02 ENCOUNTER — Other Ambulatory Visit: Payer: Self-pay | Admitting: Nurse Practitioner

## 2016-12-02 DIAGNOSIS — N6323 Unspecified lump in the left breast, lower outer quadrant: Secondary | ICD-10-CM

## 2016-12-04 ENCOUNTER — Ambulatory Visit
Admission: RE | Admit: 2016-12-04 | Discharge: 2016-12-04 | Disposition: A | Discharge status: Home / Self Care | Payer: BLUE CROSS/BLUE SHIELD | Source: Ambulatory Visit | Attending: Nurse Practitioner | Admitting: Nurse Practitioner

## 2016-12-04 DIAGNOSIS — N6323 Unspecified lump in the left breast, lower outer quadrant: Secondary | ICD-10-CM

## 2016-12-25 ENCOUNTER — Other Ambulatory Visit: Payer: Self-pay | Admitting: Family Medicine

## 2017-02-27 ENCOUNTER — Telehealth: Payer: Self-pay | Admitting: Family Medicine

## 2017-02-27 DIAGNOSIS — L659 Nonscarring hair loss, unspecified: Secondary | ICD-10-CM

## 2017-02-27 NOTE — Telephone Encounter (Signed)
Caller Name:holly Relationship to Patient:self Best number:[phone]  Pharmacy:  Reason for call: pt would like to be referred to Dr Court Joytok, in [place] -. Referral is for hair loss.  She is requesting to get an appointment sooner than December.  The previous dermatologist she saw she did not like.

## 2017-03-02 DIAGNOSIS — L659 Nonscarring hair loss, unspecified: Secondary | ICD-10-CM | POA: Insufficient documentation

## 2017-03-02 NOTE — Telephone Encounter (Signed)
Ref done Will route to [name] 

## 2017-04-03 ENCOUNTER — Other Ambulatory Visit: Payer: Self-pay | Admitting: *Deleted

## 2017-04-03 MED ORDER — MONTELUKAST SODIUM 10 MG PO TABS
10.0000 mg | ORAL_TABLET | Freq: Every day | ORAL | 3 refills | Status: DC
Start: 2017-04-03 — End: 2020-04-20

## 2017-04-03 NOTE — Telephone Encounter (Signed)
[facility] requesting that Dr. Milinda Antisower take over refills of this med. They said that pt use to get Rx from Dr. Downing CallasSharma. Pt had an acute appt with Jae DireKate, NP on 11/21/16, but hasn't seen Dr. Milinda Antisower since 10/23/14 and no future appts, please advise

## 2017-04-03 NOTE — Telephone Encounter (Signed)
Please refill times 3 and schedule a f/u with me  Thanks

## 2017-04-03 NOTE — Telephone Encounter (Signed)
appt scheduled and med refilled 

## 2017-05-18 ENCOUNTER — Ambulatory Visit: Payer: BLUE CROSS/BLUE SHIELD | Admitting: Family Medicine

## 2017-11-24 ENCOUNTER — Encounter: Payer: Self-pay | Admitting: *Deleted

## 2017-11-24 NOTE — Telephone Encounter (Signed)
This encounter was created in error - please disregard.

## 2018-01-08 ENCOUNTER — Other Ambulatory Visit: Payer: Self-pay | Admitting: Family Medicine

## 2018-01-08 NOTE — Telephone Encounter (Signed)
Please deny as she has not been seen in 3 years

## 2018-01-08 NOTE — Telephone Encounter (Signed)
No recent or future appts., please advise  

## 2018-04-25 IMAGING — MG 2D DIGITAL DIAGNOSTIC BILATERAL MAMMOGRAM WITH CAD AND ADJUNCT T
8 of 12 series · 8 of 28 positions shown · non-contrast
Comparison: Previous exam(s).

CLINICAL DATA: Patient presents for a bilateral diagnostic
examination due to a 4 week history of diffuse bilateral breast pain
and tenderness over the inferior breasts. Patient's physician feels
a palpable abnormality over the 4 o'clock position of the left
breast. Family history of breast cancer in a maternal grandmother at
age 68.

EXAM:
2D DIGITAL DIAGNOSTIC bilateral MAMMOGRAM WITH CAD AND ADJUNCT TOMO
ULTRASOUND left BREAST

[R CC]
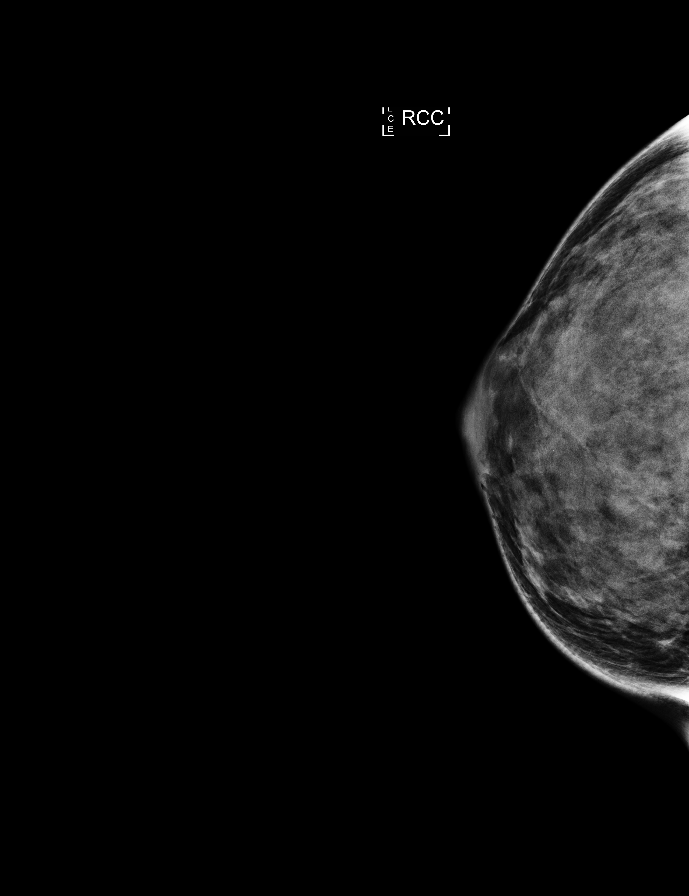

[R CC synth-2D]
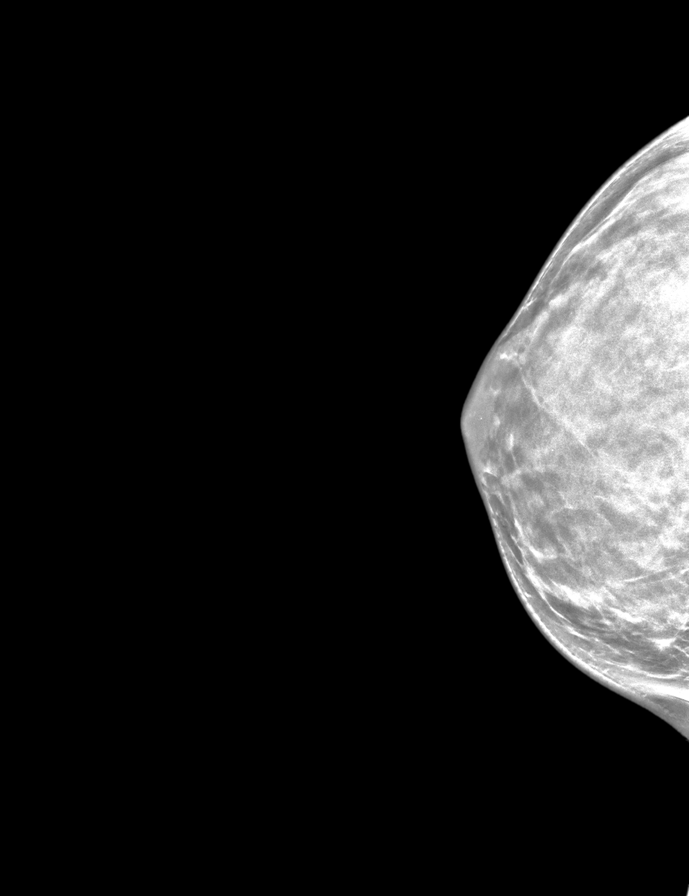

[L CC]
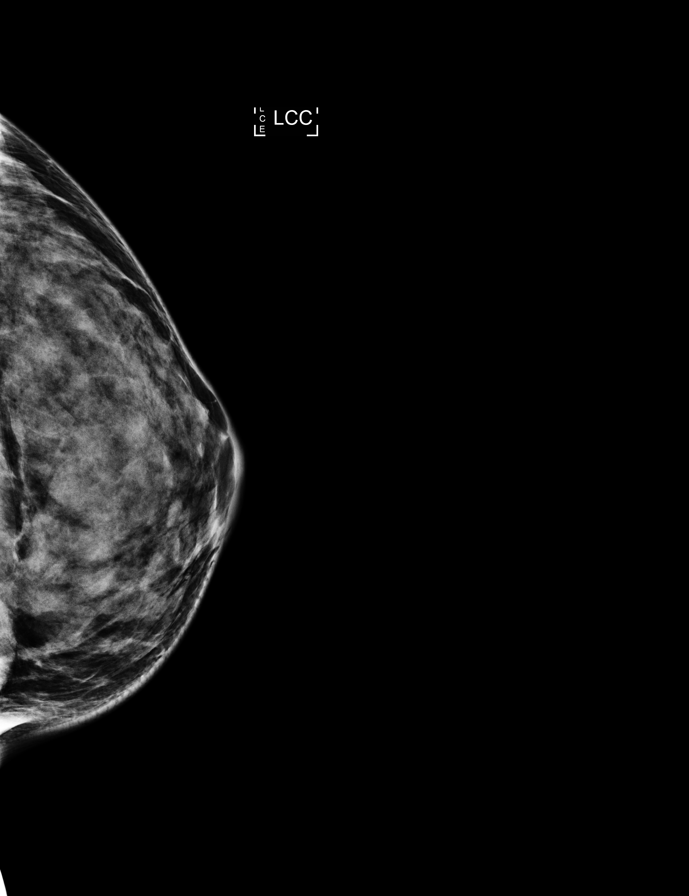

[L CC synth-2D]
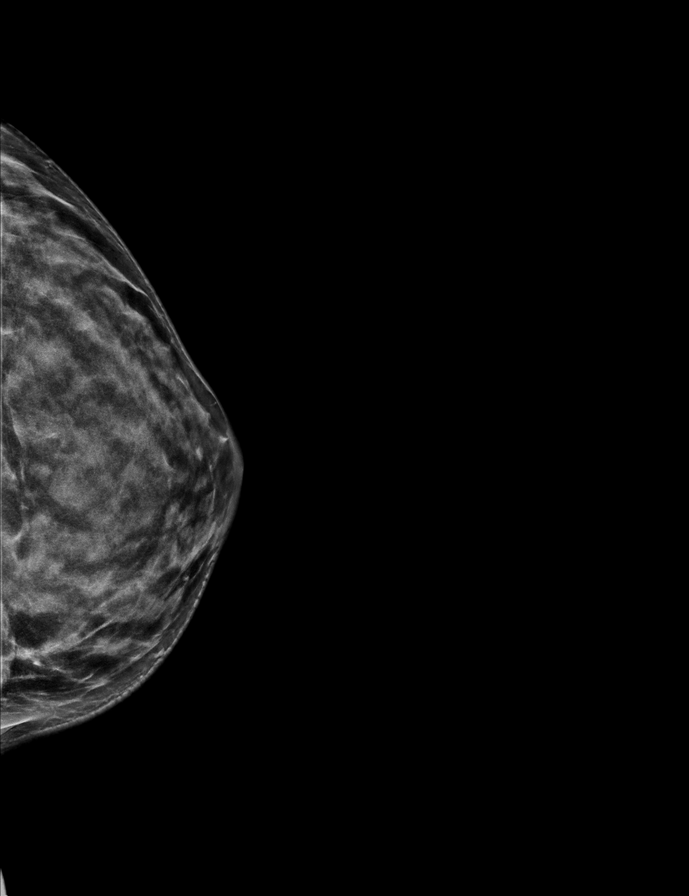

[L MLO synth-2D]
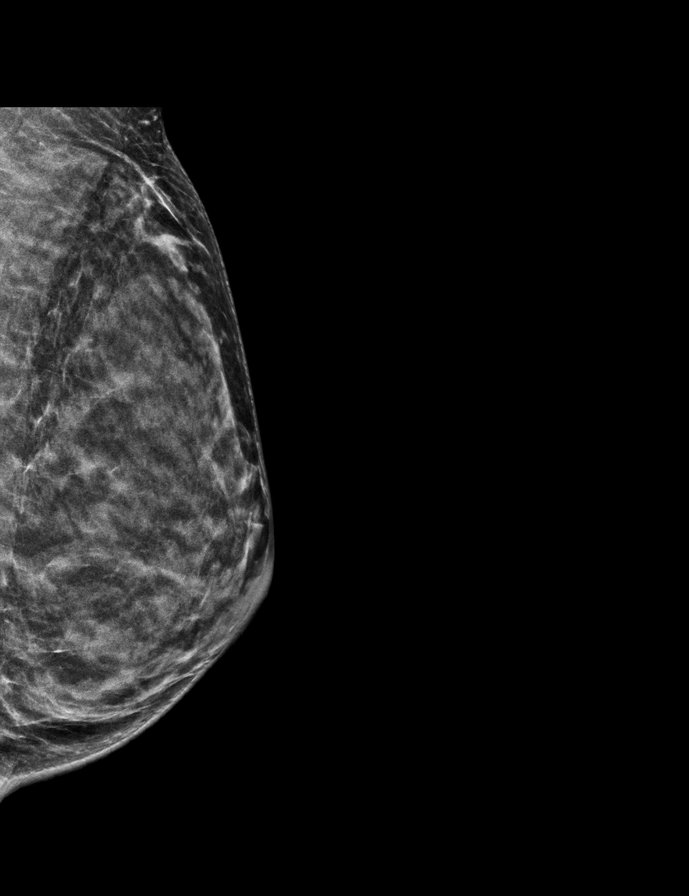

[L MLO]
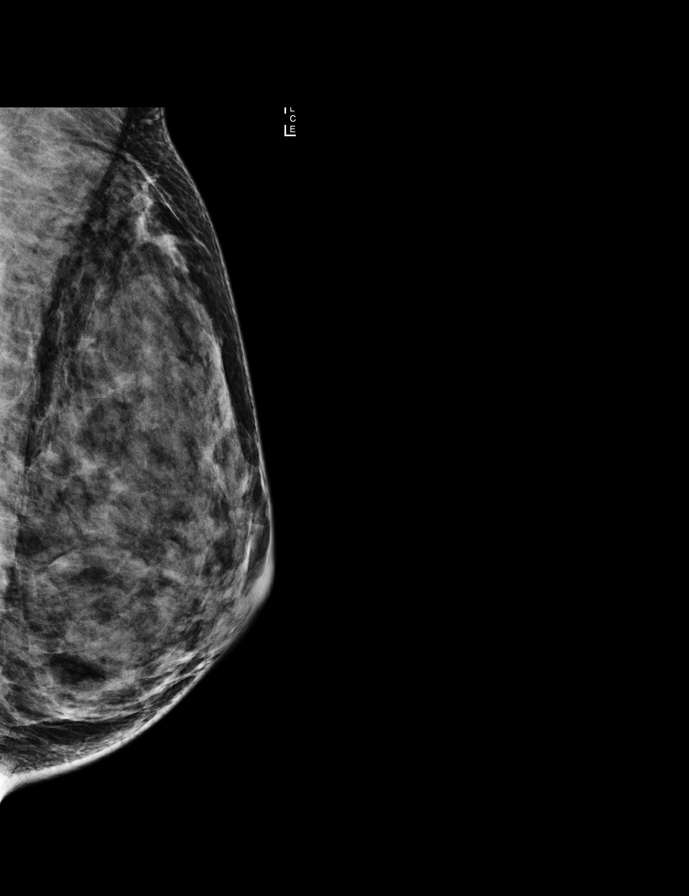

[R MLO]
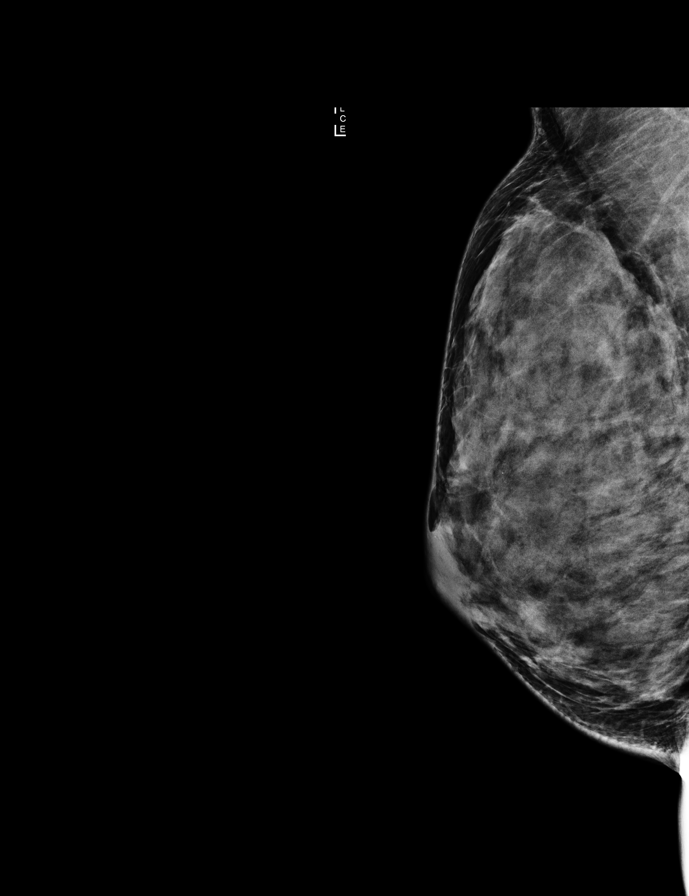

[R MLO synth-2D]
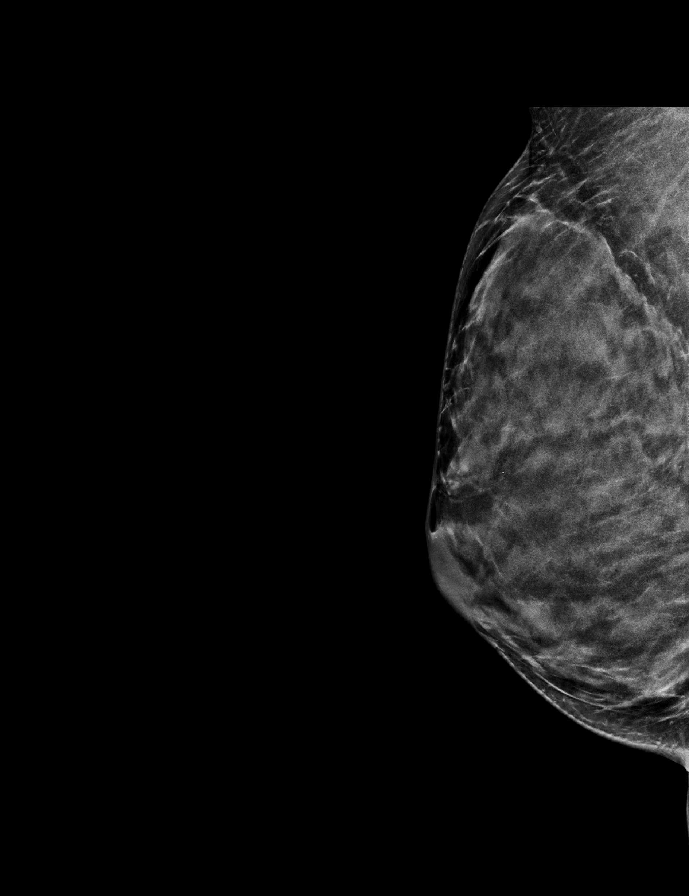

[8 of 28 positions shown; findings below may reference images not displayed]

ACR Breast Density Category c: The breast tissue is heterogeneously
dense, which may obscure small masses.
FINDINGS: Examination demonstrates no focal abnormality within the lower
breast bilaterally to account for patient's pain. No focal
abnormality over the outer lower left breast to account for
patient's palpable abnormality. Remainder of the exam is
unremarkable.

Mammographic images were processed with CAD.

On physical exam, I palpate no focal abnormality over the outer
lower left breast.

Targeted ultrasound is performed, showing no focal abnormality over
the outer lower quadrant of the left breast to account for patient's
palpable abnormality.
IMPRESSION: No focal abnormality to account for patient's bilateral breast pain.
No focal abnormality over the outer lower quadrant of the left
breast to account for patient's palpable abnormality.

RECOMMENDATION:
Recommend continued management of patient's bilateral breast pain
and left breast palpable abnormality on a clinical basis. Otherwise,
recommend beginning annual screening mammography at age 40.

I have discussed the findings and recommendations with the patient.
Results were also provided in writing at the conclusion of the
visit. If applicable, a reminder letter will be sent to the patient
regarding the next appointment.

BI-RADS CATEGORY  1: Negative.

## 2018-12-28 ENCOUNTER — Telehealth: Payer: Self-pay | Admitting: Family Medicine

## 2018-12-28 MED ORDER — SCOPOLAMINE 1 MG/3DAYS TD PT72
1.0000 | MEDICATED_PATCH | TRANSDERMAL | 0 refills | Status: DC
Start: 2018-12-28 — End: 2020-04-20

## 2018-12-28 NOTE — Telephone Encounter (Signed)
Patient stated she will be travel to the [place] to deep sea fish next Friday, She would like to know if you are able to send in a prescription for Scopolamine for her to use while she is out there  [place]     Patient's C/B # [phone]

## 2018-12-28 NOTE — Telephone Encounter (Signed)
I sent it  

## 2019-04-04 DIAGNOSIS — R87619 Unspecified abnormal cytological findings in specimens from cervix uteri: Secondary | ICD-10-CM | POA: Insufficient documentation

## 2019-06-03 NOTE — L&D Delivery Note (Signed)
Delivery Note At 6:56 AM a viable female was delivered via Vaginal, Spontaneous (Presentation:   Occiput Anterior).  APGAR: 9, 9; weight  .   Placenta status: Spontaneous, Intact.  Cord: 3 vessels with the following complications: None.   Anesthesia: Epidural Episiotomy: None Lacerations: 2nd degree Suture Repair: vicryl Est. Blood Loss (mL):    Mom to postpartum.  Baby to Couplet care / Skin to Skin.  Lyn Henri 04/19/2020, 7:23 AM

## 2019-09-06 LAB — OB RESULTS CONSOLE RPR: RPR: NONREACTIVE

## 2019-09-06 LAB — OB RESULTS CONSOLE ABO/RH: RH Type: POSITIVE

## 2019-09-06 LAB — OB RESULTS CONSOLE RUBELLA ANTIBODY, IGM: Rubella: NON-IMMUNE/NOT IMMUNE

## 2019-09-06 LAB — OB RESULTS CONSOLE HIV ANTIBODY (ROUTINE TESTING): HIV: NONREACTIVE

## 2019-09-06 LAB — OB RESULTS CONSOLE GC/CHLAMYDIA
Chlamydia: NEGATIVE
Gonorrhea: NEGATIVE

## 2019-09-06 LAB — OB RESULTS CONSOLE ANTIBODY SCREEN: Antibody Screen: NEGATIVE

## 2019-09-06 LAB — OB RESULTS CONSOLE HEPATITIS B SURFACE ANTIGEN: Hepatitis B Surface Ag: NEGATIVE

## 2019-09-09 ENCOUNTER — Inpatient Hospital Stay (HOSPITAL_COMMUNITY)
Admission: AD | Admit: 2019-09-09 | Payer: BC Managed Care – PPO | Source: Home / Self Care | Admitting: Obstetrics & Gynecology

## 2020-03-12 LAB — OB RESULTS CONSOLE GBS: GBS: NEGATIVE

## 2020-03-30 ENCOUNTER — Ambulatory Visit (INDEPENDENT_AMBULATORY_CARE_PROVIDER_SITE_OTHER): Payer: Self-pay | Admitting: Pediatrics

## 2020-03-30 ENCOUNTER — Other Ambulatory Visit: Payer: Self-pay

## 2020-03-30 DIAGNOSIS — Z7681 Expectant parent(s) prebirth pediatrician visit: Secondary | ICD-10-CM

## 2020-03-31 ENCOUNTER — Encounter: Payer: Self-pay | Admitting: Pediatrics

## 2020-03-31 DIAGNOSIS — Z7681 Expectant parent(s) prebirth pediatrician visit: Secondary | ICD-10-CM | POA: Insufficient documentation

## 2020-03-31 NOTE — Progress Notes (Signed)
Prenatal counseling for impending newborn done-- Z76.81  

## 2020-04-10 ENCOUNTER — Encounter (HOSPITAL_COMMUNITY): Payer: Self-pay | Admitting: *Deleted

## 2020-04-10 ENCOUNTER — Telehealth (HOSPITAL_COMMUNITY): Payer: Self-pay | Admitting: *Deleted

## 2020-04-10 NOTE — Telephone Encounter (Signed)
Preadmission screen  

## 2020-04-18 ENCOUNTER — Other Ambulatory Visit (HOSPITAL_COMMUNITY): Payer: BC Managed Care – PPO | Attending: Obstetrics and Gynecology

## 2020-04-18 NOTE — H&P (Signed)
Lindsey Clements is a 34 y.o. female presenting for IOL at post dates. Pregnancy complicated by migraine HA, asthma, scoliosis, rubella NI OB History    Gravida  1   Para      Term      Preterm      AB      Living        SAB      TAB      Ectopic      Multiple      Live Births             Past Medical History:  Diagnosis Date  . Mild acne   . Mononucleosis 12/10  . Scoliosis    Past Surgical History:  Procedure Laterality Date  . MYRINGOTOMY    . TONSILLECTOMY    . WISDOM TOOTH EXTRACTION     Family History: family history includes Breast cancer (age of onset: 27) in her maternal grandmother; Congestive Heart Failure in her maternal grandmother; Hypertension in her mother; Thyroid disease in her mother. Social History:  reports that she has never smoked. She has never used smokeless tobacco. She reports that she does not drink alcohol and does not use drugs.     Maternal Diabetes: No Genetic Screening: Normal Maternal Ultrasounds/Referrals: Normal Fetal Ultrasounds or other Referrals:  None Maternal Substance Abuse:  No Significant Maternal Medications:  None Significant Maternal Lab Results:  Group B Strep negative Other Comments:  None  Review of Systems  Constitutional: Negative for fever.  Eyes: Negative for visual disturbance.  Gastrointestinal: Negative for abdominal pain.  Neurological: Negative for headaches.   Maternal Medical History:  Fetal activity: Perceived fetal activity is normal.        There were no vitals taken for this visit. Maternal Exam:  Abdomen: Fetal presentation: vertex     Physical Exam Cardiovascular:     Rate and Rhythm: Normal rate.  Pulmonary:     Effort: Pulmonary effort is normal.    Cx 3/70/-2 in office  Prenatal labs: ABO, Rh: O/Positive/-- (04/06 0000) Antibody: Negative (04/06 0000) Rubella: Nonimmune (04/06 0000) RPR: Nonreactive (04/06 0000)  HBsAg: Negative (04/06 0000)  HIV: Non-reactive  (04/06 0000)  GBS: Negative/-- (10/11 0000)   Assessment/Plan: 34 yo for two stage IOL   Lindsey Clements II 04/18/2020, 5:33 PM

## 2020-04-19 ENCOUNTER — Inpatient Hospital Stay (HOSPITAL_COMMUNITY): Payer: BC Managed Care – PPO

## 2020-04-19 ENCOUNTER — Encounter (HOSPITAL_COMMUNITY): Payer: Self-pay | Admitting: Obstetrics and Gynecology

## 2020-04-19 ENCOUNTER — Inpatient Hospital Stay (HOSPITAL_COMMUNITY): Payer: BC Managed Care – PPO | Admitting: Anesthesiology

## 2020-04-19 ENCOUNTER — Inpatient Hospital Stay (HOSPITAL_COMMUNITY)
Admission: AD | Admit: 2020-04-19 | Discharge: 2020-04-20 | DRG: 807 | Disposition: A | Discharge status: Home / Self Care | Payer: BC Managed Care – PPO | Attending: Obstetrics and Gynecology | Admitting: Obstetrics and Gynecology

## 2020-04-19 ENCOUNTER — Other Ambulatory Visit: Payer: Self-pay

## 2020-04-19 DIAGNOSIS — O480 Post-term pregnancy: Secondary | ICD-10-CM | POA: Diagnosis present

## 2020-04-19 DIAGNOSIS — Z3A40 40 weeks gestation of pregnancy: Secondary | ICD-10-CM | POA: Diagnosis not present

## 2020-04-19 DIAGNOSIS — Z20822 Contact with and (suspected) exposure to covid-19: Secondary | ICD-10-CM | POA: Diagnosis present

## 2020-04-19 DIAGNOSIS — Z3490 Encounter for supervision of normal pregnancy, unspecified, unspecified trimester: Secondary | ICD-10-CM

## 2020-04-19 DIAGNOSIS — Z23 Encounter for immunization: Secondary | ICD-10-CM

## 2020-04-19 LAB — TYPE AND SCREEN
ABO/RH(D): O POS
Antibody Screen: NEGATIVE

## 2020-04-19 LAB — CBC
HCT: 37.2 % (ref 36.0–46.0)
Hemoglobin: 12.3 g/dL (ref 12.0–15.0)
MCH: 28.1 pg (ref 26.0–34.0)
MCHC: 33.1 g/dL (ref 30.0–36.0)
MCV: 85.1 fL (ref 80.0–100.0)
Platelets: 235 10*3/uL (ref 150–400)
RBC: 4.37 MIL/uL (ref 3.87–5.11)
RDW: 14 % (ref 11.5–15.5)
WBC: 12.2 10*3/uL — ABNORMAL HIGH (ref 4.0–10.5)
nRBC: 0 % (ref 0.0–0.2)

## 2020-04-19 LAB — RESPIRATORY PANEL BY RT PCR (FLU A&B, COVID)
Influenza A by PCR: NEGATIVE
Influenza B by PCR: NEGATIVE
SARS Coronavirus 2 by RT PCR: NEGATIVE

## 2020-04-19 LAB — RPR: RPR Ser Ql: NONREACTIVE

## 2020-04-19 MED ORDER — FENTANYL CITRATE (PF) 100 MCG/2ML IJ SOLN
50.0000 ug | INTRAMUSCULAR | Status: DC | PRN
  Administered 2020-04-19: 100 ug via INTRAVENOUS
  Filled 2020-04-19: qty 2

## 2020-04-19 MED ORDER — ONDANSETRON HCL 4 MG/2ML IJ SOLN: 4.0000 mg | INTRAMUSCULAR | Status: DC | PRN

## 2020-04-19 MED ORDER — LACTATED RINGERS IV SOLN: 500.0000 mL | INTRAVENOUS | Status: DC | PRN

## 2020-04-19 MED ORDER — BENZOCAINE-MENTHOL 20-0.5 % EX AERO
1.0000 "application " | INHALATION_SPRAY | CUTANEOUS | Status: DC | PRN
  Administered 2020-04-19: 1 via TOPICAL
  Filled 2020-04-19: qty 56

## 2020-04-19 MED ORDER — SENNOSIDES-DOCUSATE SODIUM 8.6-50 MG PO TABS
2.0000 | ORAL_TABLET | ORAL | Status: DC
  Administered 2020-04-20: 2 via ORAL
  Filled 2020-04-19: qty 2

## 2020-04-19 MED ORDER — LIDOCAINE HCL (PF) 1 % IJ SOLN
INTRAMUSCULAR | Status: DC | PRN
  Administered 2020-04-19: 5 mL via EPIDURAL
  Administered 2020-04-19: 3 mL via EPIDURAL
  Administered 2020-04-19: 2 mL via EPIDURAL

## 2020-04-19 MED ORDER — PHENYLEPHRINE 40 MCG/ML (10ML) SYRINGE FOR IV PUSH (FOR BLOOD PRESSURE SUPPORT): 80.0000 ug | PREFILLED_SYRINGE | INTRAVENOUS | Status: DC | PRN

## 2020-04-19 MED ORDER — OXYCODONE-ACETAMINOPHEN 5-325 MG PO TABS: 1.0000 | ORAL_TABLET | ORAL | Status: DC | PRN

## 2020-04-19 MED ORDER — SOD CITRATE-CITRIC ACID 500-334 MG/5ML PO SOLN: 30.0000 mL | ORAL | Status: DC | PRN

## 2020-04-19 MED ORDER — MISOPROSTOL 25 MCG QUARTER TABLET
25.0000 ug | ORAL_TABLET | ORAL | Status: DC | PRN
  Filled 2020-04-19: qty 1

## 2020-04-19 MED ORDER — DIPHENHYDRAMINE HCL 25 MG PO CAPS: 25.0000 mg | ORAL_CAPSULE | Freq: Four times a day (QID) | ORAL | Status: DC | PRN

## 2020-04-19 MED ORDER — FENTANYL-BUPIVACAINE-NACL 0.5-0.125-0.9 MG/250ML-% EP SOLN
12.0000 mL/h | EPIDURAL | Status: DC | PRN
  Filled 2020-04-19: qty 250

## 2020-04-19 MED ORDER — EPHEDRINE 5 MG/ML INJ: 10.0000 mg | INTRAVENOUS | Status: DC | PRN

## 2020-04-19 MED ORDER — TERBUTALINE SULFATE 1 MG/ML IJ SOLN: 0.2500 mg | Freq: Once | INTRAMUSCULAR | Status: DC | PRN

## 2020-04-19 MED ORDER — WITCH HAZEL-GLYCERIN EX PADS: 1.0000 "application " | MEDICATED_PAD | CUTANEOUS | Status: DC | PRN

## 2020-04-19 MED ORDER — ZOLPIDEM TARTRATE 5 MG PO TABS: 5.0000 mg | ORAL_TABLET | Freq: Every evening | ORAL | Status: DC | PRN

## 2020-04-19 MED ORDER — LIDOCAINE HCL (PF) 1 % IJ SOLN: 30.0000 mL | INTRAMUSCULAR | Status: DC | PRN

## 2020-04-19 MED ORDER — LACTATED RINGERS IV SOLN: 500.0000 mL | Freq: Once | INTRAVENOUS | Status: DC

## 2020-04-19 MED ORDER — PRENATAL MULTIVITAMIN CH
1.0000 | ORAL_TABLET | Freq: Every day | ORAL | Status: DC
  Administered 2020-04-19: 1 via ORAL
  Filled 2020-04-19: qty 1

## 2020-04-19 MED ORDER — ACETAMINOPHEN 325 MG PO TABS: 650.0000 mg | ORAL_TABLET | ORAL | Status: DC | PRN

## 2020-04-19 MED ORDER — COCONUT OIL OIL
1.0000 "application " | TOPICAL_OIL | Status: DC | PRN
  Administered 2020-04-19: 1 via TOPICAL

## 2020-04-19 MED ORDER — ONDANSETRON HCL 4 MG PO TABS: 4.0000 mg | ORAL_TABLET | ORAL | Status: DC | PRN

## 2020-04-19 MED ORDER — ONDANSETRON HCL 4 MG/2ML IJ SOLN: 4.0000 mg | Freq: Four times a day (QID) | INTRAMUSCULAR | Status: DC | PRN

## 2020-04-19 MED ORDER — SODIUM CHLORIDE (PF) 0.9 % IJ SOLN
INTRAMUSCULAR | Status: DC | PRN
  Administered 2020-04-19: 12 mL/h via EPIDURAL

## 2020-04-19 MED ORDER — FLUTICASONE PROPIONATE 50 MCG/ACT NA SUSP
2.0000 | Freq: Every day | NASAL | Status: DC
  Filled 2020-04-19: qty 16

## 2020-04-19 MED ORDER — OXYCODONE-ACETAMINOPHEN 5-325 MG PO TABS: 2.0000 | ORAL_TABLET | ORAL | Status: DC | PRN

## 2020-04-19 MED ORDER — FLEET ENEMA 7-19 GM/118ML RE ENEM: 1.0000 | ENEMA | RECTAL | Status: DC | PRN

## 2020-04-19 MED ORDER — LACTATED RINGERS IV SOLN: INTRAVENOUS | Status: DC

## 2020-04-19 MED ORDER — IBUPROFEN 600 MG PO TABS
600.0000 mg | ORAL_TABLET | Freq: Four times a day (QID) | ORAL | Status: DC
  Administered 2020-04-19 – 2020-04-20 (×4): 600 mg via ORAL
  Filled 2020-04-19 (×4): qty 1

## 2020-04-19 MED ORDER — OXYTOCIN-SODIUM CHLORIDE 30-0.9 UT/500ML-% IV SOLN
2.5000 [IU]/h | INTRAVENOUS | Status: DC
  Filled 2020-04-19: qty 500

## 2020-04-19 MED ORDER — DIBUCAINE (PERIANAL) 1 % EX OINT: 1.0000 "application " | TOPICAL_OINTMENT | CUTANEOUS | Status: DC | PRN

## 2020-04-19 MED ORDER — LACTATED RINGERS IV SOLN
500.0000 mL | Freq: Once | INTRAVENOUS | Status: AC
  Administered 2020-04-19: 500 mL via INTRAVENOUS

## 2020-04-19 MED ORDER — MONTELUKAST SODIUM 10 MG PO TABS
10.0000 mg | ORAL_TABLET | Freq: Every day | ORAL | Status: DC
  Filled 2020-04-19: qty 1

## 2020-04-19 MED ORDER — DIPHENHYDRAMINE HCL 50 MG/ML IJ SOLN: 12.5000 mg | INTRAMUSCULAR | Status: DC | PRN

## 2020-04-19 MED ORDER — TETANUS-DIPHTH-ACELL PERTUSSIS 5-2.5-18.5 LF-MCG/0.5 IM SUSY: 0.5000 mL | PREFILLED_SYRINGE | Freq: Once | INTRAMUSCULAR | Status: DC

## 2020-04-19 MED ORDER — OXYTOCIN BOLUS FROM INFUSION
333.0000 mL | Freq: Once | INTRAVENOUS | Status: AC
  Administered 2020-04-19: 333 mL via INTRAVENOUS

## 2020-04-19 MED ORDER — SIMETHICONE 80 MG PO CHEW: 80.0000 mg | CHEWABLE_TABLET | ORAL | Status: DC | PRN

## 2020-04-19 MED ORDER — OXYTOCIN-SODIUM CHLORIDE 30-0.9 UT/500ML-% IV SOLN
1.0000 m[IU]/min | INTRAVENOUS | Status: DC
  Administered 2020-04-19: 2 m[IU]/min via INTRAVENOUS

## 2020-04-19 NOTE — Progress Notes (Signed)
At bedside, patient has progressed quickly, now currently 8/90/-1.  Just received epidural, still setting in. Patient uncomfortable with rectal pressure.  On my check, cervix 9.5/100/0.  Lip on maternal right.  AROM performed in typical fashion with return of clear fluid.  Continue current management, anticipate vaginal delivery.  Nilda Simmer MD

## 2020-04-19 NOTE — Anesthesia Procedure Notes (Signed)
Epidural Patient location during procedure: OB Start time: 04/19/2020 4:33 AM End time: 04/19/2020 4:40 AM  Staffing Anesthesiologist: Marcene Duos, MD Performed: anesthesiologist   Preanesthetic Checklist Completed: patient identified, IV checked, site marked, risks and benefits discussed, surgical consent, monitors and equipment checked, pre-op evaluation and timeout performed  Epidural Patient position: sitting Prep: DuraPrep and site prepped and draped Patient monitoring: continuous pulse ox and blood pressure Approach: midline Location: L4-L5 Injection technique: LOR air  Needle:  Needle type: Tuohy  Needle gauge: 17 G Needle length: 9 cm and 9 Needle insertion depth: 7 cm Catheter type: closed end flexible Catheter size: 19 Gauge Catheter at skin depth: 12 cm Test dose: negative  Assessment Events: blood not aspirated, injection not painful, no injection resistance, no paresthesia and negative IV test

## 2020-04-19 NOTE — Lactation Note (Signed)
This note was copied from a baby's chart. Lactation Consultation Note  Patient Name: Lindsey Clements YBFXO'V Date: 04/19/2020 Reason for consult: Initial assessment  Initial visit to 7 hours old infant of a P1 mother. Mother's main goal is to breastfeed this infant. Infant is sleeping in mother's arms upon arrival. Mother states breastfeeding is going well and does not reports any problems at this point.   Reviewed with mother average size of a NB stomach. Encourage to follow babies' hunger and fullness cues. Reviewed importance to offer the breast 8 to 12 times in a 24-hour period for proper stimulation and to establish good milk supply.     Reviewed breastfeeding basics. Discussed milk coming to volume. Reviewed newborn behavior and expectations during the first days of life. Promoted maternal rest, hydration and nutrition. Encouraged to contact [facility] for support, questions or concerns.    Father and maternal grandmother are present and supportive.   All questions answered at this time.    Maternal Data Formula Feeding for Exclusion: No Has patient been taught Hand Expression?: No Does the patient have breastfeeding experience prior to this delivery?: No  Feeding Feeding Type: Breast Fed  Interventions Interventions: Breast feeding basics reviewed;Hand pump  Lactation Tools Discussed/Used WIC Program: No   Consult Status Consult Status: Follow-up Date: 04/20/20 Follow-up type: In-patient    Rudy Luhmann A Higuera Ancidey 04/19/2020, 2:13 PM

## 2020-04-19 NOTE — Anesthesia Preprocedure Evaluation (Signed)
Anesthesia Evaluation  Patient identified by MRN, date of birth, ID band Patient awake    Reviewed: Allergy & Precautions, NPO status , Patient's Chart, lab work & pertinent test results  Airway Mallampati: II       Dental   Pulmonary neg pulmonary ROS,    Pulmonary exam normal        Cardiovascular negative cardio ROS Normal cardiovascular exam     Neuro/Psych  Headaches,    GI/Hepatic negative GI ROS, Neg liver ROS,   Endo/Other  negative endocrine ROS  Renal/GU negative Renal ROS     Musculoskeletal   Abdominal   Peds  Hematology negative hematology ROS (+)   Anesthesia Other Findings   Reproductive/Obstetrics (+) Pregnancy                             Anesthesia Physical Anesthesia Plan  ASA: II  Anesthesia Plan: Epidural   Post-op Pain Management:    Induction:   PONV Risk Score and Plan: Treatment may vary due to age or medical condition  Airway Management Planned: Natural Airway  Additional Equipment:   Intra-op Plan:   Post-operative Plan:   Informed Consent: I have reviewed the patients History and Physical, chart, labs and discussed the procedure including the risks, benefits and alternatives for the proposed anesthesia with the patient or authorized representative who has indicated his/her understanding and acceptance.       Plan Discussed with:   Anesthesia Plan Comments:         Anesthesia Quick Evaluation

## 2020-04-20 ENCOUNTER — Inpatient Hospital Stay (HOSPITAL_COMMUNITY): Payer: BC Managed Care – PPO

## 2020-04-20 LAB — CBC
HCT: 26.8 % — ABNORMAL LOW (ref 36.0–46.0)
Hemoglobin: 9 g/dL — ABNORMAL LOW (ref 12.0–15.0)
MCH: 29.2 pg (ref 26.0–34.0)
MCHC: 33.6 g/dL (ref 30.0–36.0)
MCV: 87 fL (ref 80.0–100.0)
Platelets: 199 10*3/uL (ref 150–400)
RBC: 3.08 MIL/uL — ABNORMAL LOW (ref 3.87–5.11)
RDW: 14.6 % (ref 11.5–15.5)
WBC: 10.7 10*3/uL — ABNORMAL HIGH (ref 4.0–10.5)
nRBC: 0 % (ref 0.0–0.2)

## 2020-04-20 MED ORDER — MEASLES, MUMPS & RUBELLA VAC IJ SOLR
0.5000 mL | Freq: Once | INTRAMUSCULAR | Status: AC
  Administered 2020-04-20: 0.5 mL via SUBCUTANEOUS
  Filled 2020-04-20: qty 0.5

## 2020-04-20 NOTE — Anesthesia Postprocedure Evaluation (Signed)
Anesthesia Post Note  Patient: Nusrat Bedel  Procedure(s) Performed: AN AD HOC LABOR EPIDURAL     Patient location during evaluation: Mother Baby Anesthesia Type: Epidural Level of consciousness: awake and alert Pain management: pain level controlled Vital Signs Assessment: post-procedure vital signs reviewed and stable Respiratory status: spontaneous breathing, nonlabored ventilation and respiratory function stable Cardiovascular status: stable Postop Assessment: no headache, no backache and epidural receding Anesthetic complications: no   No complications documented.  Last Vitals:  Vitals:   04/19/20 2142 04/20/20 0616  BP: 118/78 119/77  Pulse: 82 78  Resp:    Temp: 36.8 C 36.8 C  SpO2:      Last Pain:  Vitals:   04/19/20 2142  TempSrc:   PainSc: 0-No pain   Pain Goal:                Epidural/Spinal Function Cutaneous sensation: Normal sensation (04/20/20 0754), Patient able to flex knees: Yes (04/20/20 0754), Patient able to lift hips off bed: Yes (04/20/20 0754), Back pain beyond tenderness at insertion site: No (04/20/20 0754), Progressively worsening motor and/or sensory loss: No (04/20/20 0754), Bowel and/or bladder incontinence post epidural: No (04/20/20 0754)  [facility] Records

## 2020-04-20 NOTE — Discharge Summary (Signed)
Postpartum Discharge Summary  Date of Service updated 04/20/2020     Patient Name: Lindsey Clements DOB: 07/18/1985 MRN: [id number]  Date of admission: 04/19/2020 Delivery date:04/19/2020  Delivering provider: Eyvonne Mechanic A  Date of discharge: 04/20/2020  Admitting diagnosis: Term pregnancy [Z34.90] Intrauterine pregnancy: [redacted]w[redacted]d    Secondary diagnosis:  Active Problems:   Term pregnancy  Additional problems: none    Discharge diagnosis: Term Pregnancy Delivered                                              Post partum procedures:none Augmentation: AROM and Pitocin Complications: None  Hospital course: Induction of Labor With Vaginal Delivery   34y.o. yo G1P1001 at 464w6das admitted to the hospital 04/19/2020 for induction of labor.  Indication for induction: Postdates and was fast induction.  Patient had an uncomplicated labor course as follows: Membrane Rupture Time/Date: 5:00 AM ,04/19/2020   Delivery Method:Vaginal, Spontaneous  Episiotomy: None  Lacerations:  2nd degree  Details of delivery can be found in separate delivery note.  Patient had a routine postpartum course. Patient is discharged home 04/20/20.  Newborn Data: Birth date:04/19/2020  Birth time:6:56 AM  Gender:Female  Living status:Living  Apgars:9 ,9  Weight:3229 g   Magnesium Sulfate received: No BMZ received: No Rhophylac:N/A MMR:N/A T-DaP:Given prenatally Flu: N/A Transfusion:No  Physical exam  Vitals:   04/19/20 1409 04/19/20 1758 04/19/20 2142 04/20/20 0616  BP: 123/79 134/82 118/78 119/77  Pulse: 92 85 82 78  Resp: 19 20    Temp: 98.2 F (36.8 C) 98.5 F (36.9 C) 98.2 F (36.8 C) 98.3 F (36.8 C)  TempSrc: Oral Oral    SpO2:      Weight:      Height:       General: alert Lochia: appropriate Uterine Fundus: firm Incision: N/A DVT Evaluation: No evidence of DVT seen on physical exam. Labs: Lab Results  Component Value Date   WBC 10.7 (H) 04/20/2020   HGB 9.0 (L)  04/20/2020   HCT 26.8 (L) 04/20/2020   MCV 87.0 04/20/2020   PLT 199 04/20/2020   CMP Latest Ref Rng & Units 11/21/2016  Glucose 65 - 99 mg/dL 78  BUN 7 - 25 mg/dL 10  Creatinine 0.50 - 1.10 mg/dL 0.78  Sodium 135 - 146 mmol/L 138  Potassium 3.5 - 5.3 mmol/L 4.0  Chloride 98 - 110 mmol/L 103  CO2 20 - 31 mmol/L 26  Calcium 8.6 - 10.2 mg/dL 9.3  Total Protein 6.0 - 8.3 g/dL -  Total Bilirubin 0.2 - 1.2 mg/dL -  Alkaline Phos 39 - 117 U/L -  AST 0 - 37 U/L -  ALT 0 - 35 U/L -   Edinburgh Score: Edinburgh Postnatal Depression Scale Screening Tool 04/20/2020  I have been able to laugh and see the funny side of things. 0  I have looked forward with enjoyment to things. 0  I have blamed myself unnecessarily when things went wrong. 1  I have been anxious or worried for no good reason. 0  I have felt scared or panicky for no good reason. 0  Things have been getting on top of me. 1  I have been so unhappy that I have had difficulty sleeping. 0  I have felt sad or miserable. 0  I have been so unhappy that I have been  crying. 0  The thought of harming myself has occurred to me. 0  Edinburgh Postnatal Depression Scale Total 2      After visit meds:  Allergies as of 04/20/2020      Reactions   Cephalexin    Throat swelling   Penicillins    ? Swelling of eyes and rash when she took it with mono  Thinks she may have had throat swelling      Medication List    STOP taking these medications   montelukast 10 MG tablet Commonly known as: SINGULAIR   scopolamine 1 MG/3DAYS Commonly known as: Transderm-Scop (1.5 MG)     TAKE these medications   cetirizine 10 MG tablet Commonly known as: ZYRTEC Take 10 mg by mouth as needed for allergies.   fluticasone 50 MCG/ACT nasal spray Commonly known as: FLONASE USE 2 SPRAYS IN BOTH NOSTILS ONCE DAILY      Baby girl Sadie  Discharge home in stable condition Infant Feeding: Breast Infant Disposition:home with mother Discharge  instruction: per After Visit Summary and Postpartum booklet. Activity: Advance as tolerated. Pelvic rest for 6 weeks.  Diet: routine diet Anticipated Birth Control: Unsure Postpartum Appointment:6 weeks Additional Postpartum F/U: 6 weeks Future Appointments:No future appointments. Follow up Visit:      04/20/2020 Tyson Dense, MD

## 2020-10-22 ENCOUNTER — Telehealth: Payer: Self-pay | Admitting: *Deleted

## 2020-10-22 NOTE — Telephone Encounter (Signed)
Patient called stating that she was a patient of Dr. Lucretia Roers and was playing around with a nail gun yesterday and a nail accidentally went thru her finger and hit her nail. Patient stated that she has not seen Dr. Milinda Antis in over 6 years. Patient was advised that she would be considered a new patient since it has been that long since she has been seen. Patient admitted that it was not her that had the incident but it was her husband and he has not seen a doctor in years. Patient stated that she and her husband had a TDap last year before their child was born. Patient was advised that her husband should have someone look at his finger to rule out any type of infection and she verbalized understanding. . Patient stated that she lives in [place] now and information was given to her of [place] offices in her area. Patient stated that she really appreciated the help.

## 2021-05-14 LAB — HEPATITIS C ANTIBODY: HCV Ab: NEGATIVE

## 2021-05-14 LAB — OB RESULTS CONSOLE ANTIBODY SCREEN: Antibody Screen: NEGATIVE

## 2021-05-14 LAB — OB RESULTS CONSOLE HEPATITIS B SURFACE ANTIGEN: Hepatitis B Surface Ag: NEGATIVE

## 2021-05-14 LAB — OB RESULTS CONSOLE ABO/RH: RH Type: POSITIVE

## 2021-05-14 LAB — OB RESULTS CONSOLE RPR: RPR: NONREACTIVE

## 2021-05-14 LAB — OB RESULTS CONSOLE RUBELLA ANTIBODY, IGM: Rubella: IMMUNE

## 2021-05-14 LAB — OB RESULTS CONSOLE HIV ANTIBODY (ROUTINE TESTING): HIV: NONREACTIVE

## 2021-05-21 LAB — OB RESULTS CONSOLE GC/CHLAMYDIA
Chlamydia: NEGATIVE
Neisseria Gonorrhea: NEGATIVE

## 2021-06-02 NOTE — L&D Delivery Note (Signed)
Delivery Note At 9:45 AM a viable female was delivered via Vaginal, Spontaneous (Presentation: Middle Occiput Anterior).  APGAR: 8, 9; weight pending.   Placenta status: Spontaneous;Expressed, Intact.  Cord: 3 vessels with the following complications: None.  Cord pH: n/a  Anesthesia: Epidural Episiotomy: None Lacerations: Vaginal Suture Repair: 3.0 vicryl rapide Est. Blood Loss (mL): 300  Mom to postpartum.  Baby to Couplet care / Skin to Skin.  Mitchel Honour 12/20/2021, 10:07 AM

## 2021-08-04 ENCOUNTER — Encounter (HOSPITAL_COMMUNITY): Payer: Self-pay | Admitting: Obstetrics & Gynecology

## 2021-08-04 ENCOUNTER — Inpatient Hospital Stay (HOSPITAL_COMMUNITY)
Admission: AD | Admit: 2021-08-04 | Discharge: 2021-08-04 | Disposition: A | Discharge status: Home / Self Care | Payer: No Typology Code available for payment source | Attending: Obstetrics & Gynecology | Admitting: Obstetrics & Gynecology

## 2021-08-04 ENCOUNTER — Other Ambulatory Visit: Payer: Self-pay

## 2021-08-04 DIAGNOSIS — Z3A19 19 weeks gestation of pregnancy: Secondary | ICD-10-CM | POA: Diagnosis not present

## 2021-08-04 DIAGNOSIS — O26899 Other specified pregnancy related conditions, unspecified trimester: Secondary | ICD-10-CM | POA: Diagnosis not present

## 2021-08-04 DIAGNOSIS — O09522 Supervision of elderly multigravida, second trimester: Secondary | ICD-10-CM | POA: Diagnosis not present

## 2021-08-04 DIAGNOSIS — R102 Pelvic and perineal pain: Secondary | ICD-10-CM | POA: Diagnosis not present

## 2021-08-04 DIAGNOSIS — O26892 Other specified pregnancy related conditions, second trimester: Secondary | ICD-10-CM | POA: Insufficient documentation

## 2021-08-04 LAB — URINALYSIS, ROUTINE W REFLEX MICROSCOPIC
Bilirubin Urine: NEGATIVE
Glucose, UA: NEGATIVE mg/dL
Hgb urine dipstick: NEGATIVE
Ketones, ur: NEGATIVE mg/dL
Leukocytes,Ua: NEGATIVE
Nitrite: NEGATIVE
Protein, ur: NEGATIVE mg/dL
Specific Gravity, Urine: 1.002 — ABNORMAL LOW (ref 1.005–1.030)
pH: 7 (ref 5.0–8.0)

## 2021-08-04 NOTE — MAU Note (Signed)
Pt reports to mau with c/o pelvic pressure for the past week.  Pt states she feels like "something is going to come out"  Reports pain is better when she applies pressure to vagina or when she is sitting,  Reports pain is only when standing and walking.   States she had sex last night and it "was completely normal, not painful at all"  Denies bleeding or pain.   ?

## 2021-08-04 NOTE — MAU Provider Note (Signed)
?History  ?  ? ?CSN: [id number] ? ?Arrival date and time: 08/04/21 1348 ? ? Event Date/Time  ? First Provider Initiated Contact with Patient 08/04/21 1435   ?  ? ?Chief Complaint  ?Patient presents with  ? Pelvic Pain  ? ?HPI ?Lindsey Clements is a 36 y.o. G2P1001 at [redacted]w[redacted]d who presents with pelvic pressure and feeling like something is falling out. She states it is worse when she's on her feet for long periods. She states it is not painful but pressure. She reports it helps when she supports her vagina with her hands. She denies any bleeding or leaking.  ? ?OB History   ? ? Gravida  ?2  ? Para  ?1  ? Term  ?1  ? Preterm  ?   ? AB  ?   ? Living  ?1  ?  ? ? SAB  ?   ? IAB  ?   ? Ectopic  ?   ? Multiple  ?0  ? Live Births  ?1  ?   ?  ?  ? ? ?Past Medical History:  ?Diagnosis Date  ? Mild acne   ? Mononucleosis 12/10  ? Scoliosis   ? ? ?Past Surgical History:  ?Procedure Laterality Date  ? MYRINGOTOMY    ? TONSILLECTOMY    ? WISDOM TOOTH EXTRACTION    ? ? ?Family History  ?Problem Relation Age of Onset  ? Hypertension Mother   ? Thyroid disease Mother   ? Breast cancer Maternal Grandmother 29  ? Congestive Heart Failure Maternal Grandmother   ? Miscarriages / Stillbirths Neg Hx   ? ? ?Social History  ? ?Tobacco Use  ? Smoking status: Never  ? Smokeless tobacco: Never  ?Vaping Use  ? Vaping Use: Never used  ?Substance Use Topics  ? Alcohol use: No  ?  Alcohol/week: 0.0 standard drinks  ? Drug use: No  ? ? ?Allergies:  ?Allergies  ?Allergen Reactions  ? Cephalexin   ?  Throat swelling  ? Penicillins   ?  ? Swelling of eyes and rash when she took it with mono  ?Thinks she may have had throat swelling  ? ? ?Medications Prior to Admission  ?Medication Sig Dispense Refill Last Dose  ? Prenatal Vit-Fe Fumarate-FA (PRENATAL MULTIVITAMIN) TABS tablet Take 1 tablet by mouth daily at 12 noon.     ? cetirizine (ZYRTEC) 10 MG tablet Take 10 mg by mouth as needed for allergies.     ? fluticasone (FLONASE) 50 MCG/ACT nasal spray USE 2  SPRAYS IN BOTH NOSTILS ONCE DAILY 16 g 3   ? ? ?Review of Systems  ?Constitutional: Negative.  Negative for fatigue and fever.  ?HENT: Negative.    ?Respiratory: Negative.  Negative for shortness of breath.   ?Cardiovascular: Negative.  Negative for chest pain.  ?Gastrointestinal: Negative.  Negative for abdominal pain, constipation, diarrhea, nausea and vomiting.  ?Genitourinary:  Positive for pelvic pain. Negative for dysuria, vaginal bleeding and vaginal discharge.  ?Neurological: Negative.  Negative for dizziness and headaches.  ?Physical Exam  ? ?Blood pressure 125/60, pulse 76, temperature 98.2 ?F (36.8 ?C), resp. rate 16, SpO2 100 %, unknown if currently breastfeeding. ? ?Physical Exam ?Vitals and nursing note reviewed.  ?Constitutional:   ?   General: She is not in acute distress. ?   Appearance: She is well-developed.  ?HENT:  ?   Head: Normocephalic.  ?Eyes:  ?   Pupils: Pupils are equal, round, and reactive to light.  ?Cardiovascular:  ?  Rate and Rhythm: Normal rate and regular rhythm.  ?   Heart sounds: Normal heart sounds.  ?Pulmonary:  ?   Effort: Pulmonary effort is normal. No respiratory distress.  ?   Breath sounds: Normal breath sounds.  ?Abdominal:  ?   General: Bowel sounds are normal. There is no distension.  ?   Palpations: Abdomen is soft.  ?   Tenderness: There is no abdominal tenderness.  ?Genitourinary: ?   Comments: Pelvic exam: Cervix pink, visually closed, without lesion, scant white creamy discharge, vaginal walls and external genitalia normal ?Bimanual exam: Cervix 0/long/high, firm, anterior, neg CMT, uterus nontender, adnexa without tenderness, enlargement, or mass ? ? ?Skin: ?   General: Skin is warm and dry.  ?Neurological:  ?   Mental Status: She is alert and oriented to person, place, and time.  ?Psychiatric:     ?   Mood and Affect: Mood normal.     ?   Behavior: Behavior normal.     ?   Thought Content: Thought content normal.     ?   Judgment: Judgment normal.  ? ?FHT: 138  bpm ? ?MAU Course  ?Procedures ?Results for orders placed or performed during the hospital encounter of 08/04/21 (from the past 24 hour(s))  ?Urinalysis, Routine w reflex microscopic     Status: Abnormal  ? Collection Time: 08/04/21  1:51 PM  ?Result Value Ref Range  ? Color, Urine COLORLESS (A) YELLOW  ? APPearance CLEAR CLEAR  ? Specific Gravity, Urine 1.002 (L) 1.005 - 1.030  ? pH 7.0 5.0 - 8.0  ? Glucose, UA NEGATIVE NEGATIVE mg/dL  ? Hgb urine dipstick NEGATIVE NEGATIVE  ? Bilirubin Urine NEGATIVE NEGATIVE  ? Ketones, ur NEGATIVE NEGATIVE mg/dL  ? Protein, ur NEGATIVE NEGATIVE mg/dL  ? Nitrite NEGATIVE NEGATIVE  ? Leukocytes,Ua NEGATIVE NEGATIVE  ?  ?MDM ?UA ?Reassurance provided of normalcy of exam. Discussed muscle changes in pregnancy and how they can feel different each pregnancy. Warning signs reviewed at length and encouraged patient to use pregnancy support belt and supportive underwear. ? ?Assessment and Plan  ? ?1. Pelvic pressure in pregnancy   ?2. [redacted] weeks gestation of pregnancy   ? ?-Discharge home in stable condition ?-Preterm labor precautions discussed ?-Patient advised to follow-up with OB as scheduled for prenatal care on Tuesday ?-Patient may return to MAU as needed or if her condition were to change or worsen ? ? ?Rolm Bookbinder CNM ?08/04/2021, 2:35 PM  ?

## 2021-08-04 NOTE — Discharge Instructions (Signed)

## 2021-11-12 ENCOUNTER — Other Ambulatory Visit: Payer: Self-pay

## 2021-11-12 ENCOUNTER — Inpatient Hospital Stay (HOSPITAL_COMMUNITY)
Admission: AD | Admit: 2021-11-12 | Discharge: 2021-11-12 | Disposition: A | Discharge status: Home / Self Care | Payer: No Typology Code available for payment source | Attending: Obstetrics and Gynecology | Admitting: Obstetrics and Gynecology

## 2021-11-12 ENCOUNTER — Encounter (HOSPITAL_COMMUNITY): Payer: Self-pay | Admitting: Obstetrics and Gynecology

## 2021-11-12 DIAGNOSIS — T783XXA Angioneurotic edema, initial encounter: Secondary | ICD-10-CM | POA: Insufficient documentation

## 2021-11-12 DIAGNOSIS — O479 False labor, unspecified: Secondary | ICD-10-CM

## 2021-11-12 DIAGNOSIS — O4703 False labor before 37 completed weeks of gestation, third trimester: Secondary | ICD-10-CM | POA: Insufficient documentation

## 2021-11-12 DIAGNOSIS — Z3A33 33 weeks gestation of pregnancy: Secondary | ICD-10-CM | POA: Insufficient documentation

## 2021-11-12 DIAGNOSIS — O3433 Maternal care for cervical incompetence, third trimester: Secondary | ICD-10-CM | POA: Insufficient documentation

## 2021-11-12 MED ORDER — BETAMETHASONE SOD PHOS & ACET 6 (3-3) MG/ML IJ SUSP
12.0000 mg | Freq: Once | INTRAMUSCULAR | Status: AC
  Administered 2021-11-12: 12 mg via INTRAMUSCULAR
  Filled 2021-11-12: qty 5

## 2021-11-12 NOTE — MAU Provider Note (Signed)
History     [id number]  Arrival date and time: 11/12/21 1654    Chief Complaint  Patient presents with   Contractions     HPI Lindsey Clements is a 36 y.o. at [redacted]w[redacted]d who presents for preterm labor evaluation. Patient was seen in the office today & cervix was dilated 2/80/-2. Patient denies contractions, LOF, or vaginal bleeding. Reports good fetal movement.    OB History     Gravida  2   Para  1   Term  1   Preterm      AB      Living  1      SAB      IAB      Ectopic      Multiple  0   Live Births  1           Past Medical History:  Diagnosis Date   Mild acne    Mononucleosis 12/10   Scoliosis     Past Surgical History:  Procedure Laterality Date   MYRINGOTOMY     TONSILLECTOMY     WISDOM TOOTH EXTRACTION      Family History  Problem Relation Age of Onset   Hypertension Mother    Thyroid disease Mother    Breast cancer Maternal Grandmother 92   Congestive Heart Failure Maternal Grandmother    Miscarriages / Stillbirths Neg Hx     Allergies  Allergen Reactions   Cephalexin     Throat swelling   Penicillins     ? Swelling of eyes and rash when she took it with mono  Thinks she may have had throat swelling    No current facility-administered medications on file prior to encounter.   Current Outpatient Medications on File Prior to Encounter  Medication Sig Dispense Refill   cetirizine (ZYRTEC) 10 MG tablet Take 10 mg by mouth as needed for allergies.     fluticasone (FLONASE) 50 MCG/ACT nasal spray USE 2 SPRAYS IN BOTH NOSTILS ONCE DAILY 16 g 3   Prenatal Vit-Fe Fumarate-FA (PRENATAL MULTIVITAMIN) TABS tablet Take 1 tablet by mouth daily at 12 noon.       ROS Pertinent positives and negative per HPI, all others reviewed and negative  Physical Exam   BP 113/79   Pulse 90   Temp 98.3 F (36.8 C) (Oral)   Resp 19   Ht 5\' 4"  (1.626 m)   Wt 76.7 kg   SpO2 99%   BMI 29.01 kg/m   Patient Vitals for the past 24 hrs:  BP Temp  Temp src Pulse Resp SpO2 Height Weight  11/12/21 1735 113/79 -- -- 90 -- 99 % -- --  11/12/21 1713 115/70 98.3 F (36.8 C) Oral 89 19 97 % -- --  11/12/21 1707 -- -- -- -- -- -- 5\' 4"  (1.626 m) 76.7 kg    Physical Exam Vitals and nursing note reviewed. Exam conducted with a chaperone present.  Constitutional:      General: She is not in acute distress.    Appearance: Normal appearance.  Eyes:     General: No scleral icterus.    Conjunctiva/sclera: Conjunctivae normal.  Pulmonary:     Effort: Pulmonary effort is normal. No respiratory distress.  Skin:    General: Skin is warm and dry.  Neurological:     Mental Status: She is alert.  Psychiatric:        Mood and Affect: Mood normal.        Behavior: Behavior normal.  Cervical Exam Dilation: 2.5 Effacement (%): 70 Cervical Position: Posterior Station: -2 Presentation: Vertex Exam by:: Judeth Horn NP   FHT Baseline 125, moderate variability, 15x15 accels, no decels Toco: irregular Cat: 1  Labs No results found for this or any previous visit (from the past 24 hour(s)).  Imaging No results found.  MAU Course  Procedures Lab Orders  No laboratory test(s) ordered today   Meds ordered this encounter  Medications   betamethasone acetate-betamethasone sodium phosphate (CELESTONE) injection 12 mg   Imaging Orders  No imaging studies ordered today    MDM Patient with some contractions on monitor. Patient not feeling pain or contractions. Cervix unchanged with 1+ hour of monitoring.  Patient given BMZ & will return tomorrow for second dose  Dr. Henderson Cloud updated with patient's assessment.  Assessment and Plan   1. Premature cervical dilation in third trimester   2. Braxton Hick's contraction   3. [redacted] weeks gestation of pregnancy    -Reviewed preterm labor precautions & reasons to return to MAU -Return to MAU tomorrow around 6 pm for second BMZ  Judeth Horn, NP 11/12/21 7:29 PM

## 2021-11-12 NOTE — MAU Note (Signed)
Lindsey Clements is a 36 y.o. at [redacted]w[redacted]d here in MAU reporting: she was sent from [facility] office for Betamethasone and EFM secondary cervix 2 cms and 80 effaced.  Denies VB or LOF, reports loss mucus plug last Friday.  Endorses +FM.  Onset of complaint: today Pain score: 0 Vitals:   11/12/21 1713  BP: 115/70  Pulse: 89  Resp: 19  Temp: 98.3 F (36.8 C)  SpO2: 97%     FHT:129 bpm Lab orders placed from triage:   UA

## 2021-11-13 ENCOUNTER — Inpatient Hospital Stay (HOSPITAL_COMMUNITY)
Admission: AD | Admit: 2021-11-13 | Discharge: 2021-11-13 | Disposition: A | Discharge status: Home / Self Care | Payer: Self-pay | Attending: Obstetrics and Gynecology | Admitting: Obstetrics and Gynecology

## 2021-11-13 ENCOUNTER — Other Ambulatory Visit: Payer: Self-pay

## 2021-11-13 DIAGNOSIS — Z881 Allergy status to other antibiotic agents status: Secondary | ICD-10-CM | POA: Insufficient documentation

## 2021-11-13 DIAGNOSIS — O4703 False labor before 37 completed weeks of gestation, third trimester: Secondary | ICD-10-CM | POA: Insufficient documentation

## 2021-11-13 DIAGNOSIS — Z3A33 33 weeks gestation of pregnancy: Secondary | ICD-10-CM | POA: Insufficient documentation

## 2021-11-13 DIAGNOSIS — Z09 Encounter for follow-up examination after completed treatment for conditions other than malignant neoplasm: Secondary | ICD-10-CM

## 2021-11-13 MED ORDER — BETAMETHASONE SOD PHOS & ACET 6 (3-3) MG/ML IJ SUSP
12.0000 mg | Freq: Once | INTRAMUSCULAR | Status: AC
  Administered 2021-11-13: 12 mg via INTRAMUSCULAR
  Filled 2021-11-13: qty 5

## 2021-11-13 NOTE — MAU Note (Signed)
Lindsey Clements is a 37 y.o. at [redacted]w[redacted]d here in MAU reporting: here for 2nd BMZ. States is having some cramping, states it happens once every couple of hours. Seeing some brown spotting. No LOF. +FM  Onset of complaint: ongoing  Pain score: 5/10  Vitals:   11/13/21 1815  BP: 122/77  Pulse: 97  Resp: 16  Temp: 98.7 F (37.1 C)  SpO2: 98%     FHT: 98 when doppler applied, pt leaned back into chair and HR increased to 140  Lab orders placed from triage: none

## 2021-11-13 NOTE — Discharge Instructions (Signed)

## 2021-11-13 NOTE — MAU Provider Note (Signed)
History     CSN: [id number]  Arrival date and time: 11/13/21 1748   Event Date/Time   First Provider Initiated Contact with Patient 11/13/21 1823      Chief Complaint  Patient presents with   Abdominal Pain   Injections   HPI  Lindsey Clements is a 36 y.o. G2P1001 at [redacted]w[redacted]d who presents for a second dose of BMZ. She was seen in MAU yesterday for preterm contractions and was 2.5cm. Patient reports intermittent sharp pain at her pubic bone but no regular contractions. She denies any vaginal bleeding, discharge, and leaking of fluid. Denies any constipation, diarrhea or any urinary complaints. Reports normal fetal movement.   OB History     Gravida  2   Para  1   Term  1   Preterm      AB      Living  1      SAB      IAB      Ectopic      Multiple  0   Live Births  1           Past Medical History:  Diagnosis Date   Mild acne    Mononucleosis 12/10   Scoliosis     Past Surgical History:  Procedure Laterality Date   MYRINGOTOMY     TONSILLECTOMY     WISDOM TOOTH EXTRACTION      Family History  Problem Relation Age of Onset   Hypertension Mother    Thyroid disease Mother    Breast cancer Maternal Grandmother 68   Congestive Heart Failure Maternal Grandmother    Miscarriages / Stillbirths Neg Hx     Social History   Tobacco Use   Smoking status: Never   Smokeless tobacco: Never  Vaping Use   Vaping Use: Never used  Substance Use Topics   Alcohol use: No    Alcohol/week: 0.0 standard drinks of alcohol   Drug use: No    Allergies:  Allergies  Allergen Reactions   Cephalexin     Throat swelling   Penicillins     ? Swelling of eyes and rash when she took it with mono  Thinks she may have had throat swelling    Medications Prior to Admission  Medication Sig Dispense Refill Last Dose   cetirizine (ZYRTEC) 10 MG tablet Take 10 mg by mouth as needed for allergies.      fluticasone (FLONASE) 50 MCG/ACT nasal spray USE 2 SPRAYS IN BOTH  NOSTILS ONCE DAILY 16 g 3    Prenatal Vit-Fe Fumarate-FA (PRENATAL MULTIVITAMIN) TABS tablet Take 1 tablet by mouth daily at 12 noon.       Review of Systems  Constitutional: Negative.  Negative for fatigue and fever.  HENT: Negative.    Respiratory: Negative.  Negative for shortness of breath.   Cardiovascular: Negative.  Negative for chest pain.  Gastrointestinal: Negative.  Negative for abdominal pain, constipation, diarrhea, nausea and vomiting.  Genitourinary: Negative.  Negative for dysuria, vaginal bleeding and vaginal discharge.  Neurological: Negative.  Negative for dizziness and headaches.   Physical Exam   Blood pressure 122/77, pulse 97, temperature 98.7 F (37.1 C), temperature source Oral, resp. rate 16, SpO2 98 %, unknown if currently breastfeeding.  Patient Vitals for the past 24 hrs:  BP Temp Temp src Pulse Resp SpO2  11/13/21 1815 122/77 98.7 F (37.1 C) Oral 97 16 98 %    Physical Exam Vitals and nursing note reviewed.  Constitutional:  General: She is not in acute distress.    Appearance: She is well-developed.  HENT:     Head: Normocephalic.  Eyes:     Pupils: Pupils are equal, round, and reactive to light.  Cardiovascular:     Rate and Rhythm: Normal rate and regular rhythm.     Heart sounds: Normal heart sounds.  Pulmonary:     Effort: Pulmonary effort is normal. No respiratory distress.     Breath sounds: Normal breath sounds.  Abdominal:     General: Bowel sounds are normal. There is no distension.     Palpations: Abdomen is soft.     Tenderness: There is no abdominal tenderness.  Skin:    General: Skin is warm and dry.  Neurological:     Mental Status: She is alert and oriented to person, place, and time.  Psychiatric:        Mood and Affect: Mood normal.        Behavior: Behavior normal.        Thought Content: Thought content normal.        Judgment: Judgment normal.    Fetal Tracing:  Baseline: 120 Variability:  moderate Accels: 15x15 Decels: none  Toco: none  Dilation: 2.5 Effacement (%): 70 Exam by:: Coen Miyasato cnm   MAU Course  Procedures  MDM Labs ordered and reviewed.   BMZ dose 2 Patient requested cervical exam, unchanged from yesterday.   Assessment and Plan   1. Threatened premature labor in third trimester   2. [redacted] weeks gestation of pregnancy   3. Follow-up exam     -Discharge home in stable condition -Preterm labor precautions discussed -Patient advised to follow-up with OB as scheduled on Monday for prenatal care -Patient may return to MAU as needed or if her condition were to change or worsen  Wende Mott, CNM 11/13/2021, 6:24 PM

## 2021-11-17 ENCOUNTER — Inpatient Hospital Stay (HOSPITAL_COMMUNITY)
Admission: AD | Admit: 2021-11-17 | Discharge: 2021-11-17 | Disposition: A | Discharge status: Home / Self Care | Payer: No Typology Code available for payment source | Attending: Obstetrics and Gynecology | Admitting: Obstetrics and Gynecology

## 2021-11-17 ENCOUNTER — Encounter (HOSPITAL_COMMUNITY): Payer: Self-pay | Admitting: Obstetrics and Gynecology

## 2021-11-17 ENCOUNTER — Other Ambulatory Visit: Payer: Self-pay

## 2021-11-17 DIAGNOSIS — Z3A34 34 weeks gestation of pregnancy: Secondary | ICD-10-CM | POA: Diagnosis not present

## 2021-11-17 DIAGNOSIS — O09523 Supervision of elderly multigravida, third trimester: Secondary | ICD-10-CM | POA: Insufficient documentation

## 2021-11-17 DIAGNOSIS — O4703 False labor before 37 completed weeks of gestation, third trimester: Secondary | ICD-10-CM | POA: Diagnosis present

## 2021-11-17 DIAGNOSIS — O4700 False labor before 37 completed weeks of gestation, unspecified trimester: Secondary | ICD-10-CM | POA: Diagnosis not present

## 2021-11-17 LAB — URINALYSIS, ROUTINE W REFLEX MICROSCOPIC
Bilirubin Urine: NEGATIVE
Glucose, UA: NEGATIVE mg/dL
Hgb urine dipstick: NEGATIVE
Ketones, ur: NEGATIVE mg/dL
Leukocytes,Ua: NEGATIVE
Nitrite: NEGATIVE
Protein, ur: NEGATIVE mg/dL
Specific Gravity, Urine: 1.019 (ref 1.005–1.030)
pH: 6 (ref 5.0–8.0)

## 2021-11-17 NOTE — MAU Note (Signed)
Lindsey Clements is a 36 y.o. at [redacted]w[redacted]d here in MAU reporting: ctxs every 2-3 minutes apart.  Stated started this morning @ 1000, but became more frequent at noon.  Reports increased pressure in rectum.  Endorses +FM.  Denies LOF or VB.  Onset of complaint: today Pain score: 3/10 Vitals:   11/17/21 1455  BP: 114/72  Pulse: 95  Resp: 18  Temp: 98.5 F (36.9 C)  SpO2: 97%     FHT:129 bpm Lab orders placed from triage: UA

## 2021-11-17 NOTE — MAU Provider Note (Signed)
History     CSN: [id number]  Arrival date and time: 11/17/21 1441   None     Chief Complaint  Patient presents with   Contractions   HPI Lindsey Clements is a 36 y.o. G2P1001 at [redacted]w[redacted]d who presents to MAU for contractions. Patient reports contractions started around 10 am this morning, but since between 12-1230pm have gotten progressively closer together. She also reports feeling an increase in rectal pressure. She reports contractions are about every 5-6 minutes. She denies leaking fluid or vaginal bleeding. Endorses active fetal movement. She received BMX x2 doses on 6/13 and 6/14 as she was 2.5cm.   Patient receives prenatal care at [facility] and next appointment is scheduled for tomorrow 6/19.   OB History     Gravida  2   Para  1   Term  1   Preterm      AB      Living  1      SAB      IAB      Ectopic      Multiple  0   Live Births  1           Past Medical History:  Diagnosis Date   Mild acne    Mononucleosis 12/10   Scoliosis     Past Surgical History:  Procedure Laterality Date   MYRINGOTOMY     TONSILLECTOMY     WISDOM TOOTH EXTRACTION      Family History  Problem Relation Age of Onset   Hypertension Mother    Thyroid disease Mother    Breast cancer Maternal Grandmother 66   Congestive Heart Failure Maternal Grandmother    Miscarriages / Stillbirths Neg Hx     Social History   Tobacco Use   Smoking status: Never   Smokeless tobacco: Never  Vaping Use   Vaping Use: Never used  Substance Use Topics   Alcohol use: No    Alcohol/week: 0.0 standard drinks of alcohol   Drug use: No    Allergies:  Allergies  Allergen Reactions   Cephalexin     Throat swelling   Penicillins     ? Swelling of eyes and rash when she took it with mono  Thinks she may have had throat swelling    No medications prior to admission.    Review of Systems  Constitutional: Negative.   Respiratory: Negative.    Cardiovascular:  Negative.   Gastrointestinal:  Positive for abdominal pain (contractions).  Genitourinary: Negative.   Neurological: Negative.    Physical Exam  Patient Vitals for the past 24 hrs:  BP Temp Temp src Pulse Resp SpO2 Height Weight  11/17/21 1805 111/69 -- -- 90 -- -- -- --  11/17/21 1455 114/72 98.5 F (36.9 C) Oral 95 18 97 % -- --  11/17/21 1451 -- -- -- -- -- -- 5\' 4"  (1.626 m) 76.6 kg   Physical Exam Vitals and nursing note reviewed. Exam conducted with a chaperone present.  Constitutional:      General: She is not in acute distress. Eyes:     Extraocular Movements: Extraocular movements intact.     Pupils: Pupils are equal, round, and reactive to light.  Cardiovascular:     Rate and Rhythm: Normal rate.  Pulmonary:     Effort: Pulmonary effort is normal.  Abdominal:     Palpations: Abdomen is soft.     Tenderness: There is no abdominal tenderness.     Comments: Gravid  Genitourinary:    Comments: VE: 3/70/-1, vtx Musculoskeletal:        General: Normal range of motion.     Cervical back: Normal range of motion.  Skin:    General: Skin is warm and dry.  Neurological:     General: No focal deficit present.     Mental Status: She is alert and oriented to person, place, and time.  Psychiatric:        Mood and Affect: Mood normal.        Behavior: Behavior normal.        Thought Content: Thought content normal.        Judgment: Judgment normal.    NST FHR: 135 bpm, moderate variability, +15x15 accels, no decels Toco: Q 4-51mins  MAU Course  Procedures NST  MDM NST reactive and reassuring for gestational age. Contractions every 4-6 mins. Cervix 3/70/-1, vtx. PO hydrate patient. After 2 hours, cervix rechecked and found to be unchanged. Patient reports contractions are still 4-6 minutes, but rates 3-4/10. She appears to be in no distress. She is s/p BMX x2. I reviewed patient with Dr. Vincente Poli who agrees that patient is stable for discharge home with strict return  precautions. She has an OB appointment tomorrow and will follow up in office then.   Assessment and Plan  [redacted] weeks gestation of pregnancy Preterm contractions  - Discharge home in stable condition - Keep OB appointment as scheduled - Return to MAU as needed for worsening symptoms   Brand Males, CNM 11/17/2021, 6:40 PM

## 2021-12-02 LAB — OB RESULTS CONSOLE GBS: GBS: NEGATIVE

## 2021-12-18 ENCOUNTER — Encounter (HOSPITAL_COMMUNITY): Payer: Self-pay | Admitting: *Deleted

## 2021-12-18 ENCOUNTER — Telehealth (HOSPITAL_COMMUNITY): Payer: Self-pay | Admitting: *Deleted

## 2021-12-18 NOTE — Telephone Encounter (Signed)
Preadmission screen  

## 2021-12-20 ENCOUNTER — Inpatient Hospital Stay (HOSPITAL_COMMUNITY)
Admission: RE | Admit: 2021-12-20 | Discharge: 2021-12-21 | DRG: 807 | Disposition: A | Discharge status: Home / Self Care | Payer: No Typology Code available for payment source | Attending: Obstetrics & Gynecology | Admitting: Obstetrics & Gynecology

## 2021-12-20 ENCOUNTER — Inpatient Hospital Stay (HOSPITAL_COMMUNITY): Payer: No Typology Code available for payment source

## 2021-12-20 ENCOUNTER — Inpatient Hospital Stay (HOSPITAL_COMMUNITY): Payer: No Typology Code available for payment source | Admitting: Anesthesiology

## 2021-12-20 ENCOUNTER — Encounter (HOSPITAL_COMMUNITY): Payer: Self-pay | Admitting: Obstetrics & Gynecology

## 2021-12-20 DIAGNOSIS — O99354 Diseases of the nervous system complicating childbirth: Principal | ICD-10-CM | POA: Diagnosis present

## 2021-12-20 DIAGNOSIS — Z3A39 39 weeks gestation of pregnancy: Secondary | ICD-10-CM | POA: Diagnosis not present

## 2021-12-20 DIAGNOSIS — Z3490 Encounter for supervision of normal pregnancy, unspecified, unspecified trimester: Principal | ICD-10-CM

## 2021-12-20 DIAGNOSIS — M419 Scoliosis, unspecified: Secondary | ICD-10-CM | POA: Diagnosis present

## 2021-12-20 DIAGNOSIS — O26893 Other specified pregnancy related conditions, third trimester: Secondary | ICD-10-CM | POA: Diagnosis present

## 2021-12-20 LAB — CBC
HCT: 31.1 % — ABNORMAL LOW (ref 36.0–46.0)
Hemoglobin: 10.2 g/dL — ABNORMAL LOW (ref 12.0–15.0)
MCH: 26.5 pg (ref 26.0–34.0)
MCHC: 32.8 g/dL (ref 30.0–36.0)
MCV: 80.8 fL (ref 80.0–100.0)
Platelets: 206 10*3/uL (ref 150–400)
RBC: 3.85 MIL/uL — ABNORMAL LOW (ref 3.87–5.11)
RDW: 14.3 % (ref 11.5–15.5)
WBC: 10.4 10*3/uL (ref 4.0–10.5)
nRBC: 0 % (ref 0.0–0.2)

## 2021-12-20 LAB — TYPE AND SCREEN
ABO/RH(D): O POS
Antibody Screen: NEGATIVE

## 2021-12-20 LAB — RPR: RPR Ser Ql: NONREACTIVE

## 2021-12-20 MED ORDER — COCONUT OIL OIL: 1.0000 | TOPICAL_OIL | Status: DC | PRN

## 2021-12-20 MED ORDER — ZOLPIDEM TARTRATE 5 MG PO TABS: 5.0000 mg | ORAL_TABLET | Freq: Every evening | ORAL | Status: DC | PRN

## 2021-12-20 MED ORDER — OXYTOCIN-SODIUM CHLORIDE 30-0.9 UT/500ML-% IV SOLN
1.0000 m[IU]/min | INTRAVENOUS | Status: DC
  Administered 2021-12-20: 2 m[IU]/min via INTRAVENOUS
  Filled 2021-12-20: qty 500

## 2021-12-20 MED ORDER — EPHEDRINE 5 MG/ML INJ: 10.0000 mg | INTRAVENOUS | Status: DC | PRN

## 2021-12-20 MED ORDER — LACTATED RINGERS IV SOLN: INTRAVENOUS | Status: DC

## 2021-12-20 MED ORDER — LACTATED RINGERS IV SOLN: 500.0000 mL | INTRAVENOUS | Status: DC | PRN

## 2021-12-20 MED ORDER — PRENATAL MULTIVITAMIN CH
1.0000 | ORAL_TABLET | Freq: Every day | ORAL | Status: DC
  Filled 2021-12-20: qty 1

## 2021-12-20 MED ORDER — IBUPROFEN 600 MG PO TABS
600.0000 mg | ORAL_TABLET | Freq: Four times a day (QID) | ORAL | Status: DC
Start: 2021-12-20 — End: 2021-12-21
  Administered 2021-12-20 – 2021-12-21 (×4): 600 mg via ORAL
  Filled 2021-12-20 (×4): qty 1

## 2021-12-20 MED ORDER — OXYCODONE-ACETAMINOPHEN 5-325 MG PO TABS: 1.0000 | ORAL_TABLET | ORAL | Status: DC | PRN

## 2021-12-20 MED ORDER — ACETAMINOPHEN 325 MG PO TABS: 650.0000 mg | ORAL_TABLET | ORAL | Status: DC | PRN

## 2021-12-20 MED ORDER — FENTANYL CITRATE (PF) 100 MCG/2ML IJ SOLN: 50.0000 ug | INTRAMUSCULAR | Status: DC | PRN

## 2021-12-20 MED ORDER — BENZOCAINE-MENTHOL 20-0.5 % EX AERO
1.0000 | INHALATION_SPRAY | CUTANEOUS | Status: DC | PRN
  Filled 2021-12-20: qty 56

## 2021-12-20 MED ORDER — PHENYLEPHRINE 80 MCG/ML (10ML) SYRINGE FOR IV PUSH (FOR BLOOD PRESSURE SUPPORT): 80.0000 ug | PREFILLED_SYRINGE | INTRAVENOUS | Status: DC | PRN

## 2021-12-20 MED ORDER — FENTANYL-BUPIVACAINE-NACL 0.5-0.125-0.9 MG/250ML-% EP SOLN
12.0000 mL/h | EPIDURAL | Status: DC | PRN
  Administered 2021-12-20: 12 mL/h via EPIDURAL
  Filled 2021-12-20: qty 250

## 2021-12-20 MED ORDER — DIPHENHYDRAMINE HCL 25 MG PO CAPS: 25.0000 mg | ORAL_CAPSULE | Freq: Four times a day (QID) | ORAL | Status: DC | PRN

## 2021-12-20 MED ORDER — ONDANSETRON HCL 4 MG PO TABS: 4.0000 mg | ORAL_TABLET | ORAL | Status: DC | PRN

## 2021-12-20 MED ORDER — SIMETHICONE 80 MG PO CHEW: 80.0000 mg | CHEWABLE_TABLET | ORAL | Status: DC | PRN

## 2021-12-20 MED ORDER — WITCH HAZEL-GLYCERIN EX PADS: 1.0000 | MEDICATED_PAD | CUTANEOUS | Status: DC | PRN

## 2021-12-20 MED ORDER — ACETAMINOPHEN 325 MG PO TABS
650.0000 mg | ORAL_TABLET | ORAL | Status: DC | PRN
  Administered 2021-12-20: 650 mg via ORAL
  Filled 2021-12-20: qty 2

## 2021-12-20 MED ORDER — DIPHENHYDRAMINE HCL 50 MG/ML IJ SOLN: 12.5000 mg | INTRAMUSCULAR | Status: DC | PRN

## 2021-12-20 MED ORDER — LIDOCAINE HCL (PF) 1 % IJ SOLN: 30.0000 mL | INTRAMUSCULAR | Status: DC | PRN

## 2021-12-20 MED ORDER — SOD CITRATE-CITRIC ACID 500-334 MG/5ML PO SOLN: 30.0000 mL | ORAL | Status: DC | PRN

## 2021-12-20 MED ORDER — DIBUCAINE (PERIANAL) 1 % EX OINT: 1.0000 | TOPICAL_OINTMENT | CUTANEOUS | Status: DC | PRN

## 2021-12-20 MED ORDER — ONDANSETRON HCL 4 MG/2ML IJ SOLN: 4.0000 mg | INTRAMUSCULAR | Status: DC | PRN

## 2021-12-20 MED ORDER — OXYTOCIN-SODIUM CHLORIDE 30-0.9 UT/500ML-% IV SOLN
2.5000 [IU]/h | INTRAVENOUS | Status: DC
  Administered 2021-12-20: 2.5 [IU]/h via INTRAVENOUS

## 2021-12-20 MED ORDER — OXYTOCIN BOLUS FROM INFUSION
333.0000 mL | Freq: Once | INTRAVENOUS | Status: AC
  Administered 2021-12-20: 333 mL via INTRAVENOUS

## 2021-12-20 MED ORDER — SENNOSIDES-DOCUSATE SODIUM 8.6-50 MG PO TABS
2.0000 | ORAL_TABLET | Freq: Every day | ORAL | Status: DC
  Administered 2021-12-21: 2 via ORAL
  Filled 2021-12-20: qty 2

## 2021-12-20 MED ORDER — LACTATED RINGERS IV SOLN: 500.0000 mL | Freq: Once | INTRAVENOUS | Status: DC

## 2021-12-20 MED ORDER — LIDOCAINE-EPINEPHRINE (PF) 1.5 %-1:200000 IJ SOLN
INTRAMUSCULAR | Status: DC | PRN
  Administered 2021-12-20: 3 mL via EPIDURAL
  Administered 2021-12-20: 2 mL via EPIDURAL

## 2021-12-20 MED ORDER — ONDANSETRON HCL 4 MG/2ML IJ SOLN: 4.0000 mg | Freq: Four times a day (QID) | INTRAMUSCULAR | Status: DC | PRN

## 2021-12-20 MED ORDER — TERBUTALINE SULFATE 1 MG/ML IJ SOLN: 0.2500 mg | Freq: Once | INTRAMUSCULAR | Status: DC | PRN

## 2021-12-20 MED ORDER — OXYCODONE-ACETAMINOPHEN 5-325 MG PO TABS: 2.0000 | ORAL_TABLET | ORAL | Status: DC | PRN

## 2021-12-20 MED ORDER — TETANUS-DIPHTH-ACELL PERTUSSIS 5-2.5-18.5 LF-MCG/0.5 IM SUSY: 0.5000 mL | PREFILLED_SYRINGE | Freq: Once | INTRAMUSCULAR | Status: DC

## 2021-12-20 NOTE — Lactation Note (Signed)
This note was copied from a baby's chart. Lactation Consultation Note  Patient Name: Lindsey Clements [id number] Date: 12/20/2021 Reason for consult: Initial assessment;Term;Other (Comment) (cHTN) Age:35 hours  LC in to room for initial consult. Infant is latched upon arrival. Observed good alignment, optimal neck/back support and deep latch with audible swallows. Reviewed using pillows for comfort.  Reviewed normal newborn behavior during first 24h, expected output and feeding frequency.   Plan: 1-Skin to skin, aim for a deep, comfortable latch and breastfeed on demand or 8-12 times in 24h period. 2-Encouraged maternal rest, hydration and food intake.  3-Contact LC as needed for feeds/support/concerns/questions   All questions answered at this time. Provided Lactation services brochure.  Maternal Data Has patient been taught Hand Expression?: No Does the patient have breastfeeding experience prior to this delivery?: Yes How long did the patient breastfeed?: 2-3 months (stopped due to antibiotic)  Feeding Mother's Current Feeding Choice: Breast Milk  LATCH Score Latch: Grasps breast easily, tongue down, lips flanged, rhythmical sucking.  Audible Swallowing: Spontaneous and intermittent  Type of Nipple: Everted at rest and after stimulation  Comfort (Breast/Nipple): Soft / non-tender  Hold (Positioning): No assistance needed to correctly position infant at breast.  LATCH Score: 10  Interventions Interventions: Breast feeding basics reviewed;Skin to skin;Expressed milk;Support pillows;Education;LC Services brochure  Discharge Pump: Personal WIC Program: No  Consult Status Consult Status: Follow-up Date: 12/21/21 Follow-up type: In-patient    Tacha Manni A Higuera Ancidey 12/20/2021, 4:11 PM

## 2021-12-20 NOTE — H&P (Signed)
Lindsey Clements is a 36 y.o. female G2P1001 at [redacted]w[redacted]d presenting for elective IOL.  Patient was started on pitocin on arrival and is now comfortable with CLEA.  Antepartum course complicated by migraine and asthma; both stable this pregnancy.  Patient has h/o scoliosis which did not require surgery.  SUA with growth u/s 6/19 normal growth: 5#14 (74%).  AMA with low risk NIPS.  Patient had threatened PTL and received BMZ 6/13-14.  GBS negative.  OB History     Gravida  2   Para  1   Term  1   Preterm      AB      Living  1      SAB      IAB      Ectopic      Multiple  0   Live Births  1          Past Medical History:  Diagnosis Date   Medical history non-contributory    Mild acne    Mononucleosis 05/2009   Rectocele affecting pregnancy    noted in prenatal but pt wants it on her EPIC chart that she has this problem   Scoliosis    Past Surgical History:  Procedure Laterality Date   MYRINGOTOMY     TONSILLECTOMY     WISDOM TOOTH EXTRACTION     Family History: family history includes Breast cancer (age of onset: 53) in her maternal grandmother; Congestive Heart Failure in her maternal grandmother; Hypertension in her mother; Thyroid disease in her mother. Social History:  reports that she has never smoked. She has never used smokeless tobacco. She reports that she does not drink alcohol and does not use drugs.     Maternal Diabetes: No Genetic Screening: Normal Maternal Ultrasounds/Referrals: Normal Fetal Ultrasounds or other Referrals:  None Maternal Substance Abuse:  No Significant Maternal Medications:  None Significant Maternal Lab Results:  Group B Strep negative Other Comments:  None  Review of Systems Maternal Medical History:  Fetal activity: Perceived fetal activity is normal.   Last perceived fetal movement was within the past hour.   Prenatal complications: no prenatal complications Prenatal Complications - Diabetes: none.   Dilation:  7 Effacement (%): 70, 80 Station: -2 Exam by:: Dr. Langston Masker Blood pressure (!) 114/55, pulse 69, temperature 97.8 F (36.6 C), temperature source Oral, resp. rate 18, height 5\' 4"  (1.626 m), weight 78 kg, SpO2 100 %, unknown if currently breastfeeding. Maternal Exam:  Uterine Assessment: Contraction strength is moderate.  Contraction frequency is regular.  Abdomen: Patient reports no abdominal tenderness. Fundal height is c/w dates.   Estimated fetal weight is 7#8.   Fetal presentation: vertex Introitus: Normal vulva. Amniotic fluid character: clear. Pelvis: adequate for delivery.   Cervix: Cervix evaluated by digital exam.     Fetal Exam Fetal Monitor Review: Baseline rate: 120.  Variability: moderate (6-25 bpm).   Pattern: accelerations present and early decelerations.   Fetal State Assessment: Category I - tracings are normal.   Physical Exam Constitutional:      Appearance: Normal appearance.  HENT:     Head: Normocephalic and atraumatic.  Pulmonary:     Effort: Pulmonary effort is normal.  Abdominal:     Palpations: Abdomen is soft.  Genitourinary:    General: Normal vulva.  Musculoskeletal:     Cervical back: Normal range of motion.  Skin:    General: Skin is warm and dry.  Neurological:     Mental Status: She is alert and  oriented to person, place, and time.  Psychiatric:        Mood and Affect: Mood normal.        Behavior: Behavior normal.     Prenatal labs: ABO, Rh: --/--/O POS (07/21 0359) Antibody: NEG (07/21 0359) Rubella: Immune (12/13 0000) RPR: NON REACTIVE (07/21 0359)  HBsAg: Negative (12/13 0000)  HIV: Non-reactive (12/13 0000)  GBS: Negative/-- (07/03 0000)   Assessment/Plan: 36yo G2P1001 at [redacted]w[redacted]d for eIOL -S/P AROM and on pitocin -Comfortable with CLEA -Anticipate NSVD   Mitchel Honour 12/20/2021, 8:37 AM

## 2021-12-20 NOTE — Anesthesia Preprocedure Evaluation (Signed)
Anesthesia Evaluation  Patient identified by MRN, date of birth, ID band Patient awake    Reviewed: Allergy & Precautions, Patient's Chart, lab work & pertinent test results  Airway Mallampati: II       Dental  (+) Teeth Intact   Pulmonary neg pulmonary ROS,    Pulmonary exam normal        Cardiovascular negative cardio ROS Normal cardiovascular exam     Neuro/Psych  Headaches,    GI/Hepatic negative GI ROS, Neg liver ROS,   Endo/Other  negative endocrine ROS  Renal/GU negative Renal ROS     Musculoskeletal   Abdominal   Peds  Hematology negative hematology ROS (+)   Anesthesia Other Findings   Reproductive/Obstetrics (+) Pregnancy                             Anesthesia Physical  Anesthesia Plan  ASA: 2  Anesthesia Plan: Epidural   Post-op Pain Management:    Induction:   PONV Risk Score and Plan: Treatment may vary due to age or medical condition  Airway Management Planned: Natural Airway  Additional Equipment:   Intra-op Plan:   Post-operative Plan:   Informed Consent: I have reviewed the patients History and Physical, chart, labs and discussed the procedure including the risks, benefits and alternatives for the proposed anesthesia with the patient or authorized representative who has indicated his/her understanding and acceptance.       Plan Discussed with:   Anesthesia Plan Comments:         Anesthesia Quick Evaluation

## 2021-12-20 NOTE — Lactation Note (Signed)
This note was copied from a baby's chart. Lactation Consultation Note  Patient Name: Lindsey Clements VFIEP'P Date: 12/20/2021 Reason for consult: Follow-up assessment Age:35 hours  P2, Baby latched upon entering. Encouraged mother to keep baby deep on breast. Lacatition to follow up on MBU.   LATCH Score Latch: Grasps breast easily, tongue down, lips flanged, rhythmical sucking.  Audible Swallowing: A few with stimulation  Type of Nipple: Everted at rest and after stimulation  Comfort (Breast/Nipple): Soft / non-tender  Hold (Positioning): Assistance needed to correctly position infant at breast and maintain latch.  LATCH Score: 8   Interventions Interventions: Education;Skin to skin  Consult Status Consult Status: Follow-up from L&D    Dahlia Byes [facility] 12/20/2021, 11:06 AM

## 2021-12-20 NOTE — Anesthesia Procedure Notes (Signed)
Epidural Patient location during procedure: OB Start time: 12/20/2021 5:39 AM End time: 12/20/2021 6:02 AM  Staffing Anesthesiologist: Lewie Loron, MD Performed: anesthesiologist   Preanesthetic Checklist Completed: patient identified, IV checked, risks and benefits discussed, monitors and equipment checked, pre-op evaluation and timeout performed  Epidural Patient position: sitting Prep: DuraPrep and site prepped and draped Patient monitoring: heart rate, continuous pulse ox and blood pressure Approach: midline Location: L3-L4 Injection technique: LOR air and LOR saline  Needle:  Needle type: Tuohy  Needle gauge: 17 G Needle length: 9 cm Needle insertion depth: 5 cm Catheter type: closed end flexible Catheter size: 19 Gauge Catheter at skin depth: 11 cm Test dose: negative  Assessment Sensory level: T8 Events: blood not aspirated, injection not painful, no injection resistance, paresthesia and negative IV test  Additional Notes Slightly scoliotic to the R. R sided paresthesia with LOR. Re-directed needle. No paresthesia. LOR and catheter threaded smoothly.Reason for block:procedure for pain

## 2021-12-21 ENCOUNTER — Other Ambulatory Visit: Payer: Self-pay

## 2021-12-21 LAB — CBC
HCT: 26.9 % — ABNORMAL LOW (ref 36.0–46.0)
Hemoglobin: 8.6 g/dL — ABNORMAL LOW (ref 12.0–15.0)
MCH: 26 pg (ref 26.0–34.0)
MCHC: 32 g/dL (ref 30.0–36.0)
MCV: 81.3 fL (ref 80.0–100.0)
Platelets: 201 10*3/uL (ref 150–400)
RBC: 3.31 MIL/uL — ABNORMAL LOW (ref 3.87–5.11)
RDW: 14.5 % (ref 11.5–15.5)
WBC: 10 10*3/uL (ref 4.0–10.5)
nRBC: 0 % (ref 0.0–0.2)

## 2021-12-21 MED ORDER — IBUPROFEN 600 MG PO TABS: 600.0000 mg | ORAL_TABLET | Freq: Four times a day (QID) | ORAL | 0 refills | Status: DC

## 2021-12-21 NOTE — Discharge Summary (Signed)
   Postpartum Discharge Summary  Patient Name: Lindsey Clements DOB: 10/20/1985 MRN: [id number]  Date of admission: 12/20/2021 Delivery date:12/20/2021  Delivering provider: MORRIS, MEGAN  Date of discharge: 12/21/2021  Admitting diagnosis: Pregnancy [Z34.90] Intrauterine pregnancy: [redacted]w[redacted]d     Secondary diagnosis:  Principal Problem:   Pregnancy  Additional problems: none    Discharge diagnosis: Term Pregnancy Delivered                                              Post partum procedures: none Augmentation: AROM and Pitocin Complications: None  Hospital course: Induction of Labor With Vaginal Delivery   36 y.o. yo G2P2002 at [redacted]w[redacted]d was admitted to the hospital 12/20/2021 for induction of labor.  Indication for induction: Favorable cervix at term.  Patient had an uncomplicated labor course as follows: Membrane Rupture Time/Date: 8:05 AM ,12/20/2021   Delivery Method:Vaginal, Spontaneous  Episiotomy: None  Lacerations:  Vaginal  Details of delivery can be found in separate delivery note.  Patient had a routine postpartum course. Patient is discharged home 12/21/21.  Newborn Data: Birth date:12/20/2021  Birth time:9:45 AM  Gender:Female  Living status:Living  Apgars:8 ,9  Weight:4010 g   Magnesium Sulfate received: No BMZ received: No Rhophylac:No MMR:No T-DaP:Given prenatally Flu: No Transfusion:No  Physical exam  Vitals:   12/20/21 1258 12/20/21 1803 12/20/21 2052 12/21/21 0611  BP: 120/86 117/79 110/71 116/88  Pulse: 77 84 84 71  Resp: 16  18 18  Temp: 98.5 F (36.9 C) 98.1 F (36.7 C) 98.5 F (36.9 C) 98.6 F (37 C)  TempSrc: Oral Oral Oral Oral  SpO2:   98%   Weight:      Height:       General: alert, cooperative, and no distress Lochia: appropriate Uterine Fundus: firm Incision: Healing well with no significant drainage, No significant erythema DVT Evaluation: No evidence of DVT seen on physical exam. Negative Homan's sign. No cords or calf  tenderness. Labs: Lab Results  Component Value Date   WBC 10.0 12/21/2021   HGB 8.6 (L) 12/21/2021   HCT 26.9 (L) 12/21/2021   MCV 81.3 12/21/2021   PLT 201 12/21/2021      Latest Ref Rng & Units 11/21/2016    2:24 PM  CMP  Glucose 65 - 99 mg/dL 78   BUN 7 - 25 mg/dL 10   Creatinine 0.50 - 1.10 mg/dL 0.78   Sodium 135 - 146 mmol/L 138   Potassium 3.5 - 5.3 mmol/L 4.0   Chloride 98 - 110 mmol/L 103   CO2 20 - 31 mmol/L 26   Calcium 8.6 - 10.2 mg/dL 9.3    Edinburgh Score:    12/21/2021    8:17 AM  Edinburgh Postnatal Depression Scale Screening Tool  I have been able to laugh and see the funny side of things. 0  I have looked forward with enjoyment to things. 0  I have blamed myself unnecessarily when things went wrong. 0  I have been anxious or worried for no good reason. 0  I have felt scared or panicky for no good reason. 0  Things have been getting on top of me. 0  I have been so unhappy that I have had difficulty sleeping. 0  I have felt sad or miserable. 0  I have been so unhappy that I have been crying. 0  The thought of   harming myself has occurred to me. 0  Edinburgh Postnatal Depression Scale Total 0     After visit meds:  Allergies as of 12/21/2021       Reactions   Cephalexin Swelling   Throat swelling   Eggs Or Egg-derived Products Anaphylaxis, Other (See Comments)   Stomach discomfort   Penicillins Swelling   Swelling of eyes and rash when she took it with mono  Thinks she may have had throat swelling        Medication List     TAKE these medications    fluticasone 50 MCG/ACT nasal spray Commonly known as: FLONASE USE 2 SPRAYS IN BOTH NOSTILS ONCE DAILY   ibuprofen 600 MG tablet Commonly known as: ADVIL Take 1 tablet (600 mg total) by mouth every 6 (six) hours.   prenatal multivitamin Tabs tablet Take 2 tablets by mouth daily at 12 noon.         Discharge home in stable condition Infant Feeding: Breast Infant Disposition:home  with mother Discharge instruction: per After Visit Summary and Postpartum booklet. Activity: Advance as tolerated. Pelvic rest for 6 weeks.  Diet: routine diet Future Appointments:No future appointments. Follow up Visit:6 weeks PPV  12/21/2021 Megan Morris, DO    

## 2021-12-21 NOTE — Anesthesia Postprocedure Evaluation (Signed)
Anesthesia Post Note  Patient: Moni Blankenbeckler  Procedure(s) Performed: AN AD HOC LABOR EPIDURAL     Patient location during evaluation: Mother Baby Anesthesia Type: Epidural Level of consciousness: awake and alert Pain management: pain level controlled Vital Signs Assessment: post-procedure vital signs reviewed and stable Respiratory status: spontaneous breathing, nonlabored ventilation and respiratory function stable Cardiovascular status: stable Postop Assessment: no headache, no backache and epidural receding Anesthetic complications: no   No notable events documented.  Last Vitals:  Vitals:   12/20/21 2052 12/21/21 0611  BP: 110/71 116/88  Pulse: 84 71  Resp: 18 18  Temp: 36.9 C 37 C  SpO2: 98%     Last Pain:  Vitals:   12/21/21 0813  TempSrc:   PainSc: 0-No pain   Pain Goal:                Epidural/Spinal Function Cutaneous sensation: Normal sensation (12/21/21 0813), Patient able to flex knees: Yes (12/21/21 0813), Patient able to lift hips off bed: Yes (12/21/21 0813), Back pain beyond tenderness at insertion site: No (12/21/21 0813), Progressively worsening motor and/or sensory loss: No (12/21/21 0813), Bowel and/or bladder incontinence post epidural: No (12/21/21 0813)  Mauricia Area

## 2021-12-21 NOTE — Progress Notes (Signed)
Post Partum Day 1 Subjective: no complaints, up ad lib, voiding, and tolerating PO.  Patient desires circ.  Objective: Blood pressure 116/88, pulse 71, temperature 98.6 F (37 C), temperature source Oral, resp. rate 18, height 5\' 4"  (1.626 m), weight 78 kg, SpO2 98 %, unknown if currently breastfeeding.  Physical Exam:  General: alert, cooperative, and appears stated age 36: appropriate Uterine Fundus: firm Incision: healing well, no significant drainage, no dehiscence DVT Evaluation: No evidence of DVT seen on physical exam. Negative Homan's sign. No cords or calf tenderness.  Recent Labs    12/20/21 0359 12/21/21 0456  HGB 10.2* 8.6*  HCT 31.1* 26.9*    Assessment/Plan: Discharge home, Breastfeeding, and Circumcision prior to discharge Circ-patient is counseled re: risk of bleeding, infection, and scarring.  All questions were answered and patient wishes to proceed.   LOS: 1 day   12/23/21, DO 12/21/2021, 8:41 AM

## 2021-12-21 NOTE — Discharge Instructions (Signed)
Call MD for T>100.4, heavy vaginal bleeding, severe abdominal pain, or respiratory distress.  Call office to schedule postpartum visit in 6 weeks.  Pelvic rest x 6 weeks.   

## 2021-12-21 NOTE — Lactation Note (Signed)
This note was copied from a baby's chart. Lactation Consultation Note  Patient Name: Lindsey Clements ZHYQM'V Date: 12/21/2021 Reason for consult: Follow-up assessment;Term;Infant weight loss;Other (Comment) (6 % weight loss. per mom baby is in the nursery for a circ and fed prior to going. Breastfeeding is going well, feed often. LC reviewed the doc flow sheets.  WNL for age. + DAT - Bilirubin WNL. LC reviewed BF D/C teaching/ mom aware of the LC resources.) Age:35 hours After returning from a circ and  diaper change by LC, baby fell back to sleep.   Maternal Data    Feeding Mother's Current Feeding Choice: Breast Milk  LATCH Score Latch: Grasps breast easily, tongue down, lips flanged, rhythmical sucking.  Audible Swallowing: A few with stimulation  Type of Nipple: Everted at rest and after stimulation  Comfort (Breast/Nipple): Soft / non-tender  Hold (Positioning): No assistance needed to correctly position infant at breast.  LATCH Score: 9   Lactation Tools Discussed/Used    Interventions Interventions: Breast feeding basics reviewed;Education;LC Services brochure  Discharge Discharge Education: Engorgement and breast care;Warning signs for feeding baby Pump: DEBP;Manual;Personal  Consult Status Consult Status: Complete Date: 12/21/21    Kathrin Greathouse 12/21/2021, 10:28 AM

## 2021-12-27 ENCOUNTER — Telehealth (HOSPITAL_COMMUNITY): Payer: Self-pay

## 2021-12-27 NOTE — Telephone Encounter (Signed)
Patient did not answer phone call. Voicemail left for patient.   Marcelino Duster [facility] 12/27/2021,1735

## 2021-12-30 ENCOUNTER — Other Ambulatory Visit: Payer: Self-pay | Admitting: Obstetrics and Gynecology

## 2021-12-30 DIAGNOSIS — N6320 Unspecified lump in the left breast, unspecified quadrant: Secondary | ICD-10-CM

## 2021-12-31 ENCOUNTER — Ambulatory Visit
Admission: RE | Admit: 2021-12-31 | Discharge: 2021-12-31 | Disposition: A | Discharge status: Home / Self Care | Payer: No Typology Code available for payment source | Source: Ambulatory Visit | Attending: Obstetrics and Gynecology | Admitting: Obstetrics and Gynecology

## 2021-12-31 DIAGNOSIS — N6320 Unspecified lump in the left breast, unspecified quadrant: Secondary | ICD-10-CM

## 2022-09-01 DIAGNOSIS — L821 Other seborrheic keratosis: Secondary | ICD-10-CM | POA: Diagnosis not present

## 2022-09-01 DIAGNOSIS — D492 Neoplasm of unspecified behavior of bone, soft tissue, and skin: Secondary | ICD-10-CM | POA: Diagnosis not present

## 2022-09-01 DIAGNOSIS — L814 Other melanin hyperpigmentation: Secondary | ICD-10-CM | POA: Diagnosis not present

## 2022-09-01 DIAGNOSIS — B079 Viral wart, unspecified: Secondary | ICD-10-CM | POA: Diagnosis not present

## 2022-09-01 DIAGNOSIS — D225 Melanocytic nevi of trunk: Secondary | ICD-10-CM | POA: Diagnosis not present

## 2022-09-24 DIAGNOSIS — Z6827 Body mass index (BMI) 27.0-27.9, adult: Secondary | ICD-10-CM | POA: Diagnosis not present

## 2022-09-24 DIAGNOSIS — Z01419 Encounter for gynecological examination (general) (routine) without abnormal findings: Secondary | ICD-10-CM | POA: Diagnosis not present

## 2022-10-06 DIAGNOSIS — Z13220 Encounter for screening for lipoid disorders: Secondary | ICD-10-CM | POA: Diagnosis not present

## 2022-10-06 DIAGNOSIS — Z131 Encounter for screening for diabetes mellitus: Secondary | ICD-10-CM | POA: Diagnosis not present

## 2022-10-06 DIAGNOSIS — Z13228 Encounter for screening for other metabolic disorders: Secondary | ICD-10-CM | POA: Diagnosis not present

## 2022-10-06 DIAGNOSIS — Z1329 Encounter for screening for other suspected endocrine disorder: Secondary | ICD-10-CM | POA: Diagnosis not present

## 2022-10-07 LAB — LAB REPORT - SCANNED: TSH: 1.47 (ref 0.41–5.90)

## 2022-10-07 LAB — HEMOGLOBIN A1C: A1c: 5.2

## 2022-10-07 LAB — COMPREHENSIVE METABOLIC PANEL: EGFR: 102

## 2022-11-20 DIAGNOSIS — N898 Other specified noninflammatory disorders of vagina: Secondary | ICD-10-CM | POA: Diagnosis not present

## 2022-11-20 LAB — LAB REPORT - SCANNED: Chlamydia, Swab/Urine, PCR: NEGATIVE

## 2023-01-05 DIAGNOSIS — T63441A Toxic effect of venom of bees, accidental (unintentional), initial encounter: Secondary | ICD-10-CM | POA: Diagnosis not present

## 2023-02-12 DIAGNOSIS — N393 Stress incontinence (female) (male): Secondary | ICD-10-CM | POA: Diagnosis not present

## 2023-02-12 DIAGNOSIS — R109 Unspecified abdominal pain: Secondary | ICD-10-CM | POA: Diagnosis not present

## 2023-02-12 DIAGNOSIS — M6208 Separation of muscle (nontraumatic), other site: Secondary | ICD-10-CM | POA: Diagnosis not present

## 2023-03-19 ENCOUNTER — Other Ambulatory Visit: Payer: Self-pay

## 2023-03-19 ENCOUNTER — Emergency Department (HOSPITAL_BASED_OUTPATIENT_CLINIC_OR_DEPARTMENT_OTHER)
Admission: EM | Admit: 2023-03-19 | Discharge: 2023-03-19 | Disposition: A | Discharge status: Home / Self Care | Payer: BC Managed Care – PPO | Attending: Emergency Medicine | Admitting: Emergency Medicine

## 2023-03-19 ENCOUNTER — Other Ambulatory Visit (HOSPITAL_BASED_OUTPATIENT_CLINIC_OR_DEPARTMENT_OTHER): Payer: Self-pay

## 2023-03-19 ENCOUNTER — Emergency Department (HOSPITAL_BASED_OUTPATIENT_CLINIC_OR_DEPARTMENT_OTHER): Payer: BC Managed Care – PPO

## 2023-03-19 ENCOUNTER — Encounter (HOSPITAL_BASED_OUTPATIENT_CLINIC_OR_DEPARTMENT_OTHER): Payer: Self-pay

## 2023-03-19 DIAGNOSIS — K429 Umbilical hernia without obstruction or gangrene: Secondary | ICD-10-CM | POA: Diagnosis not present

## 2023-03-19 DIAGNOSIS — R1013 Epigastric pain: Secondary | ICD-10-CM | POA: Insufficient documentation

## 2023-03-19 DIAGNOSIS — R109 Unspecified abdominal pain: Secondary | ICD-10-CM | POA: Diagnosis not present

## 2023-03-19 DIAGNOSIS — K760 Fatty (change of) liver, not elsewhere classified: Secondary | ICD-10-CM | POA: Diagnosis not present

## 2023-03-19 DIAGNOSIS — R1011 Right upper quadrant pain: Secondary | ICD-10-CM | POA: Diagnosis not present

## 2023-03-19 LAB — COMPREHENSIVE METABOLIC PANEL
ALT: 8 U/L (ref 0–44)
AST: 14 U/L — ABNORMAL LOW (ref 15–41)
Albumin: 4.5 g/dL (ref 3.5–5.0)
Alkaline Phosphatase: 55 U/L (ref 38–126)
Anion gap: 6 (ref 5–15)
BUN: 18 mg/dL (ref 6–20)
CO2: 30 mmol/L (ref 22–32)
Calcium: 9.5 mg/dL (ref 8.9–10.3)
Chloride: 104 mmol/L (ref 98–111)
Creatinine, Ser: 0.81 mg/dL (ref 0.44–1.00)
GFR, Estimated: >60 mL/min (ref >60)
Glucose, Bld: 87 mg/dL (ref 70–99)
Potassium: 3.8 mmol/L (ref 3.5–5.1)
Sodium: 140 mmol/L (ref 135–145)
Total Bilirubin: 0.6 mg/dL (ref 0.3–1.2)
Total Protein: 7.2 g/dL (ref 6.5–8.1)

## 2023-03-19 LAB — CBC
HCT: 38.9 % (ref 36.0–46.0)
Hemoglobin: 13.5 g/dL (ref 12.0–15.0)
MCH: 28.3 pg (ref 26.0–34.0)
MCHC: 34.7 g/dL (ref 30.0–36.0)
MCV: 81.6 fL (ref 80.0–100.0)
Platelets: 291 10*3/uL (ref 150–400)
RBC: 4.77 MIL/uL (ref 3.87–5.11)
RDW: 12.9 % (ref 11.5–15.5)
WBC: 5.1 10*3/uL (ref 4.0–10.5)
nRBC: 0 % (ref 0.0–0.2)

## 2023-03-19 LAB — URINALYSIS, ROUTINE W REFLEX MICROSCOPIC
Bilirubin Urine: NEGATIVE
Glucose, UA: NEGATIVE mg/dL
Hgb urine dipstick: NEGATIVE
Leukocytes,Ua: NEGATIVE
Nitrite: NEGATIVE
Protein, ur: NEGATIVE mg/dL
Specific Gravity, Urine: >1.046 — ABNORMAL HIGH (ref 1.005–1.030)
pH: 7 (ref 5.0–8.0)

## 2023-03-19 LAB — LIPASE, BLOOD: Lipase: 20 U/L (ref 11–51)

## 2023-03-19 LAB — PREGNANCY, URINE: Preg Test, Ur: NEGATIVE

## 2023-03-19 MED ORDER — OMEPRAZOLE 20 MG PO CPDR
20.0000 mg | DELAYED_RELEASE_CAPSULE | Freq: Every day | ORAL | 1 refills | Status: DC
  Filled 2023-03-19: qty 15, 15d supply, fill #0

## 2023-03-19 MED ORDER — ONDANSETRON 4 MG PO TBDP
4.0000 mg | ORAL_TABLET | Freq: Three times a day (TID) | ORAL | 0 refills | Status: DC | PRN
  Filled 2023-03-19: qty 20, 7d supply, fill #0

## 2023-03-19 MED ORDER — IOHEXOL 300 MG/ML  SOLN
100.0000 mL | Freq: Once | INTRAMUSCULAR | Status: AC | PRN
  Administered 2023-03-19: 100 mL via INTRAVENOUS

## 2023-03-19 NOTE — ED Notes (Signed)
Urine sample delivered to lab.

## 2023-03-19 NOTE — Discharge Instructions (Addendum)
Take the Prilosec as directed.  Make an appointment to follow-up with gastroenterology.  Take the Zofran as needed for nausea and vomiting.  Today's workup without any acute findings.  But this always could be peptic ulcer disease.

## 2023-03-19 NOTE — ED Notes (Signed)
Lab called about not receiving urine on this patient. I Lance Morin, RN who reports that she will investigate the missing urine problem. Will follow up.

## 2023-03-19 NOTE — ED Provider Notes (Signed)
[place] EMERGENCY DEPARTMENT AT [facility] Provider Note   CSN: [id number] Arrival date & time: 03/19/23  3086     History  Chief Complaint  Patient presents with   Abdominal Pain    Lindsey Clements is a 37 y.o. female.  Patient with a complaint of epigastric abdominal pain since giving birth last year.  Patient states pain increases after eating.  Suggestive may be a peptic ulcer disease.  Associated nausea vomiting at times apparently patient states pain is currently 5 out of 10.  Past medical history noncontributory.  Patient is had tonsillectomy surgical lysed patient is not does not use tobacco products.  Patient has not had an evaluation for this.  She is followed by GYN.       Home Medications Prior to Admission medications   Medication Sig Start Date End Date Taking? Authorizing Provider  fluticasone (FLONASE) 50 MCG/ACT nasal spray USE 2 SPRAYS IN BOTH NOSTILS ONCE DAILY Patient not taking: Reported on 12/20/2021 12/25/16   Tower, Audrie Gallus, MD  ibuprofen (ADVIL) 600 MG tablet Take 1 tablet (600 mg total) by mouth every 6 (six) hours. 12/21/21   Mitchel Honour, DO  Prenatal Vit-Fe Fumarate-FA (PRENATAL MULTIVITAMIN) TABS tablet Take 2 tablets by mouth daily at 12 noon.    [provider]      Allergies    Cephalexin, Egg-derived products, and Penicillins    Review of Systems   Review of Systems  Constitutional:  Negative for chills and fever.  HENT:  Negative for ear pain and sore throat.   Eyes:  Negative for pain and visual disturbance.  Respiratory:  Negative for cough and shortness of breath.   Cardiovascular:  Negative for chest pain and palpitations.  Gastrointestinal:  Positive for abdominal distention, nausea and vomiting. Negative for abdominal pain.  Genitourinary:  Negative for dysuria and hematuria.  Musculoskeletal:  Negative for arthralgias and back pain.  Skin:  Negative for color change and rash.  Neurological:  Negative for  seizures and syncope.  All other systems reviewed and are negative.   Physical Exam Updated Vital Signs BP 120/86   Pulse 82   Temp 98.7 F (37.1 C)   Resp 18   Ht 1.626 m (5\' 4" )   Wt 65.8 kg   LMP 03/13/2023 (Exact Date)   SpO2 99%   Breastfeeding No   BMI 24.89 kg/m  Physical Exam Vitals and nursing note reviewed.  Constitutional:      General: She is not in acute distress.    Appearance: Normal appearance. She is well-developed.  HENT:     Head: Normocephalic and atraumatic.  Eyes:     Conjunctiva/sclera: Conjunctivae normal.  Cardiovascular:     Rate and Rhythm: Normal rate and regular rhythm.     Heart sounds: No murmur heard. Pulmonary:     Effort: Pulmonary effort is normal. No respiratory distress.     Breath sounds: Normal breath sounds.  Abdominal:     General: There is no distension.     Palpations: Abdomen is soft.     Tenderness: There is no abdominal tenderness. There is no guarding.  Musculoskeletal:        General: No swelling.     Cervical back: Neck supple.  Skin:    General: Skin is warm and dry.     Capillary Refill: Capillary refill takes less than 2 seconds.  Neurological:     General: No focal deficit present.     Mental Status: She is  alert and oriented to person, place, and time.  Psychiatric:        Mood and Affect: Mood normal.     ED Results / Procedures / Treatments   Labs (all labs ordered are listed, but only abnormal results are displayed) Labs Reviewed  COMPREHENSIVE METABOLIC PANEL - Abnormal; Notable for the following components:      Result Value   AST 14 (*)    All other components within normal limits  URINALYSIS, ROUTINE W REFLEX MICROSCOPIC - Abnormal; Notable for the following components:   Color, Urine COLORLESS (*)    Specific Gravity, Urine >1.046 (*)    Ketones, ur TRACE (*)    All other components within normal limits  LIPASE, BLOOD  CBC  PREGNANCY, URINE    EKG None  Radiology CT ABDOMEN PELVIS W  CONTRAST  Result Date: 03/19/2023 CLINICAL DATA:  Abdominal pain, acute, nonlocalized. Epigastric pain. EXAM: CT ABDOMEN AND PELVIS WITH CONTRAST TECHNIQUE: Multidetector CT imaging of the abdomen and pelvis was performed using the standard protocol following bolus administration of intravenous contrast. RADIATION DOSE REDUCTION: This exam was performed according to the departmental dose-optimization program which includes automated exposure control, adjustment of the mA and/or kV according to patient size and/or use of iterative reconstruction technique. CONTRAST:  OMNIPAQUE IOHEXOL 300 MG/ML  SOLN COMPARISON:  None Available. FINDINGS: Lower chest: The lung bases are clear. No pleural effusion. The heart is normal in size. No pericardial effusion. Hepatobiliary: The liver is normal in size. Non-cirrhotic configuration. No suspicious mass. These is mild diffuse hepatic steatosis. There is a subcentimeter hypoattenuating focus in the right hepatic lobe, segment 7, which is too small to adequately characterize. No intrahepatic or extrahepatic bile duct dilation. No calcified gallstones. Normal gallbladder wall thickness. No pericholecystic inflammatory changes. Pancreas: Unremarkable. No pancreatic ductal dilatation or surrounding inflammatory changes. Spleen: Within normal limits. No focal lesion. Adrenals/Urinary Tract: Adrenal glands are unremarkable. No suspicious renal mass. No hydronephrosis. No renal or ureteric calculi. Unremarkable urinary bladder. Stomach/Bowel: No disproportionate dilation of the small or large bowel loops. No evidence of abnormal bowel wall thickening or inflammatory changes. The appendix is unremarkable. Vascular/Lymphatic: No ascites or pneumoperitoneum. No abdominal or pelvic lymphadenopathy, by size criteria. No aneurysmal dilation of the major abdominal arteries. Reproductive: The uterus is unremarkable. The ovaries are unremarkable. Other: There is a tiny fat containing  umbilical hernia. The soft tissues and abdominal wall are otherwise unremarkable. Musculoskeletal: No suspicious osseous lesions. IMPRESSION: 1. No acute findings in the abdomen or pelvis. 2. Mild hepatic steatosis. Electronically Signed   By: Jules Schick M.D.   On: 03/19/2023 13:17   US Abdomen Limited RUQ (LIVER/GB)  Result Date: 03/19/2023 CLINICAL DATA:  Right upper quadrant pain for 1 year. Increases after eating. EXAM: ULTRASOUND ABDOMEN LIMITED RIGHT UPPER QUADRANT COMPARISON:  None Available. FINDINGS: Gallbladder: No gallstones or wall thickening visualized. No sonographic Murphy sign noted by sonographer. Common bile duct: Diameter: Less than 4 mm.  No intrahepatic bile duct dilation. Liver: No focal lesion identified. Within normal limits in parenchymal echogenicity. Portal vein is patent on color Doppler imaging with normal direction of blood flow towards the liver. Other: None. IMPRESSION: *Normal right upper quadrant sonogram. Electronically Signed   By: Jules Schick M.D.   On: 03/19/2023 08:51    Procedures Procedures    Medications Ordered in ED Medications  iohexol (OMNIPAQUE) 300 MG/ML solution 100 mL (100 mLs Intravenous Contrast Given 03/19/23 1112)    ED Course/ Medical  Decision Making/ A&P                                 Medical Decision Making Amount and/or Complexity of Data Reviewed Labs: ordered. Radiology: ordered.  Risk Prescription drug management.   Patient's workup here without any acute findings.  Ultrasound showed normal gallbladder normal common bile duct.  But because of the intermittent pain now for a period of time went head did CT scan of the abdomen.  CT scan of the abdomen is also negative.  Patient's labs lipase was 20 complete metabolic panel normal liver function test normal renal function test normal.  CBC no leukocytosis hemoglobin 13.5 urinalysis somewhat hemoconcentrated but negative for urinary tract infection pregnancy test was  negative.  Will get patient referral to GI medicine since she does not have a primary care doctor.  And will also give her referral to the wellness clinic.  Will give patient prescription for Zofran and a prescription for Prilosec as a trial to see if it helps things.   Final Clinical Impression(s) / ED Diagnoses Final diagnoses:  Epigastric pain    Rx / DC Orders ED Discharge Orders     None         Vanetta Mulders, MD 03/19/23 1341

## 2023-03-19 NOTE — ED Triage Notes (Signed)
Pt POV from home c/o epigastric pain intermittently since giving birth last year. States pain increases after eating. States occasional N/V w/ symptoms. Currently c/o 5/10 pain

## 2023-03-23 NOTE — Plan of Care (Signed)
[facility] Tonsillectomy/Adenoidectomy, Postoperative PEDS care plan entered in error.

## 2023-04-01 DIAGNOSIS — L309 Dermatitis, unspecified: Secondary | ICD-10-CM | POA: Diagnosis not present

## 2023-04-06 DIAGNOSIS — L239 Allergic contact dermatitis, unspecified cause: Secondary | ICD-10-CM | POA: Diagnosis not present

## 2023-04-09 DIAGNOSIS — L239 Allergic contact dermatitis, unspecified cause: Secondary | ICD-10-CM | POA: Diagnosis not present

## 2023-04-28 ENCOUNTER — Ambulatory Visit (INDEPENDENT_AMBULATORY_CARE_PROVIDER_SITE_OTHER): Payer: BC Managed Care – PPO | Admitting: Family Medicine

## 2023-04-28 ENCOUNTER — Encounter (INDEPENDENT_AMBULATORY_CARE_PROVIDER_SITE_OTHER): Payer: Self-pay | Admitting: Family Medicine

## 2023-04-28 ENCOUNTER — Encounter: Payer: Self-pay | Admitting: Family Medicine

## 2023-04-28 VITALS — BP 90/55 | HR 82 | Temp 98.3°F | Ht 64.0 in | Wt 141.0 lb

## 2023-04-28 DIAGNOSIS — R21 Rash and other nonspecific skin eruption: Secondary | ICD-10-CM

## 2023-04-28 DIAGNOSIS — L659 Nonscarring hair loss, unspecified: Secondary | ICD-10-CM | POA: Diagnosis not present

## 2023-04-28 DIAGNOSIS — K760 Fatty (change of) liver, not elsewhere classified: Secondary | ICD-10-CM | POA: Insufficient documentation

## 2023-04-28 DIAGNOSIS — R4586 Emotional lability: Secondary | ICD-10-CM

## 2023-04-28 DIAGNOSIS — K429 Umbilical hernia without obstruction or gangrene: Secondary | ICD-10-CM | POA: Insufficient documentation

## 2023-04-28 NOTE — Progress Notes (Signed)
Subjective: ZO:[id number] care, follow up CT done in ER HPI: Lindsey Clements is a 37 y.o. female presenting to clinic today for:  1. Epigastric pain Reports that she was free for epigastric pain and sought care in the ER.  She had CAT scan done.  Would like to go over those results.  The CAT scan showed a mild fatty liver and a small umbilical hernia that was fat-containing.  She does not drink alcohol.  She switched over to whole foods and has been avoiding processed foods for a while now in efforts to alleviate the GI symptoms and this really has improved her gastric symptoms quite a bit.  When she eats things like hot dogs or chili she certainly sees an increased epigastric issue.  No blood in stool.  No reports of nausea, vomiting.  Has recently become physically active again after delivering her child, Lindsey Clements.  She has been physically active all of her life.  She is working on improving the core strength again.  Her husband will undergo vasectomy for contraception.  No plans for any future children going forward.  She does report that she had a random rash occur on her face and had quite a bit of testing done with her dermatologist but this was unremarkable.  She has not had any blood testing to look for alpha gal but would be interested in this.  They really have not been able to pinpoint why she gets this rash on her face and eyelids.  Patient's last menstrual period was 04/15/2023.   Past Medical History:  Diagnosis Date   Medical history non-contributory    Mild acne    Mononucleosis 05/2009   Rectocele affecting pregnancy    noted in prenatal but pt wants it on her EPIC chart that she has this problem   Scoliosis    Past Surgical History:  Procedure Laterality Date   MYRINGOTOMY     TONSILLECTOMY     WISDOM TOOTH EXTRACTION     Social History   Socioeconomic History   Marital status: Married    Spouse name: Philippa Sicks   Number of children: 2   Years of education: Not on file    Highest education level: Not on file  Occupational History   Occupation: hairdresser   Occupation: [employer]  Tobacco Use   Smoking status: Never   Smokeless tobacco: Never  Vaping Use   Vaping status: Never Used  Substance and Sexual Activity   Alcohol use: No    Alcohol/week: 0.0 standard drinks of alcohol   Drug use: No   Sexual activity: Not Currently    Comment: spouse vasectomy  Other Topics Concern   Not on file  Social History Narrative   Exercises regularly. Eats whole foods.     No ETOH, Tobacco or drug use   Christian faith. Ok with blood products if needed.   Married to [place]. 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[place]   Social Determinants of Corporate investment banker Strain: Not on [employer] Insecurity: Not on file  Transportation Needs: Not on file  Physical Activity: Not on file  Stress: Not on file  Social Connections: Not on file  Intimate Partner Violence: Not on file   Current Meds  Medication Sig   [DISCONTINUED] fluticasone (FLONASE) 50 MCG/ACT nasal spray USE 2 SPRAYS IN BOTH NOSTILS ONCE DAILY   [DISCONTINUED] ibuprofen (ADVIL) 600  MG tablet Take 1 tablet (600 mg total) by mouth every 6 (six) hours.   [DISCONTINUED] omeprazole (PRILOSEC) 20 MG capsule Take 1 capsule (20 mg total) by mouth daily.   [DISCONTINUED] ondansetron (ZOFRAN-ODT) 4 MG disintegrating tablet Take 1 tablet (4 mg total) by mouth every 8 (eight) hours as needed for nausea or vomiting.   [DISCONTINUED] Prenatal Vit-Fe Fumarate-FA (PRENATAL MULTIVITAMIN) TABS tablet Take 2 tablets by mouth daily at 12 noon.   Family History  Problem Relation Age of Onset   Hypertension Mother    Thyroid disease Mother        hyperthyroid   Alcohol abuse Maternal Uncle    Hyperlipidemia Maternal Grandmother    Diabetes Maternal Grandmother    Breast cancer Maternal Grandmother 72   Congestive Heart Failure Maternal Grandmother     Alcohol abuse Maternal Grandfather    Hyperlipidemia Maternal Grandfather    Emphysema Paternal Grandmother    Alcohol abuse Paternal Grandmother    Alzheimer's disease Paternal Grandfather    Alcohol abuse Paternal Grandfather    Alcohol abuse Half-Brother    Miscarriages / Stillbirths Neg Hx    Allergies  Allergen Reactions   Cephalexin Swelling    Throat swelling   Egg-Derived Products Anaphylaxis and Other (See Comments)    Stomach discomfort   Penicillins Swelling    Swelling of eyes and rash when she took it with mono  Thinks she may have had throat swelling     Health Maintenance: pap done with Dr Langston Masker, OB  ROS: Per HPI  Objective: Office vital signs reviewed. BP (!) 90/55   Pulse 82   Temp 98.3 F (36.8 C)   Ht 5\' 4"  (1.626 m)   Wt 141 lb (64 kg)   LMP 04/15/2023   SpO2 98%   BMI 24.20 kg/m   Physical Examination:  General: Awake, alert, well nourished, No acute distress HEENT: Sclera white.  Moist mucous membranes Cardio: regular rate and rhythm, S1S2 heard, no murmurs appreciated Pulm: clear to auscultation bilaterally, no wheezes, rhonchi or rales; normal work of breathing on room air GI: soft, minimal epigastric tenderness without guarding or rebound, non-distended, bowel sounds present x4, no hepatomegaly, no splenomegaly, no masses GU: Nongravid uterus.  No adnexal masses appreciated Extremities: warm, well perfused, No edema  MSK: normal gait and station Skin: dry; intact; no active rashes appreciated Neuro: No focal neurologic deficits     04/28/2023    9:14 AM  Depression screen PHQ 2/9  Decreased Interest 0  Down, Depressed, Hopeless 0  PHQ - 2 Score 0  Altered sleeping 0  Tired, decreased energy 0  Change in appetite 0  Feeling bad or failure about yourself  0  Trouble concentrating 0  Moving slowly or fidgety/restless 0  Suicidal thoughts 0  PHQ-9 Score 0  Difficult doing work/chores Not difficult at all      04/28/2023     9:14 AM  GAD 7 : Generalized Anxiety Score  Nervous, Anxious, on Edge 0  Control/stop worrying 0  Worry too much - different things 0  Trouble relaxing 0  Restless 0  Easily annoyed or irritable 0  Afraid - awful might happen 0  Total GAD 7 Score 0  Anxiety Difficulty Not difficult at all   CT ABDOMEN PELVIS W CONTRAST  Result Date: 03/19/2023 CLINICAL DATA:  Abdominal pain, acute, nonlocalized. Epigastric pain. EXAM: CT ABDOMEN AND PELVIS WITH CONTRAST TECHNIQUE: Multidetector CT imaging of the abdomen and pelvis was performed using the  standard protocol following bolus administration of intravenous contrast. RADIATION DOSE REDUCTION: This exam was performed according to the departmental dose-optimization program which includes automated exposure control, adjustment of the mA and/or kV according to patient size and/or use of iterative reconstruction technique. CONTRAST:  OMNIPAQUE IOHEXOL 300 MG/ML  SOLN COMPARISON:  None Available. FINDINGS: Lower chest: The lung bases are clear. No pleural effusion. The heart is normal in size. No pericardial effusion. Hepatobiliary: The liver is normal in size. Non-cirrhotic configuration. No suspicious mass. These is mild diffuse hepatic steatosis. There is a subcentimeter hypoattenuating focus in the right hepatic lobe, segment 7, which is too small to adequately characterize. No intrahepatic or extrahepatic bile duct dilation. No calcified gallstones. Normal gallbladder wall thickness. No pericholecystic inflammatory changes. Pancreas: Unremarkable. No pancreatic ductal dilatation or surrounding inflammatory changes. Spleen: Within normal limits. No focal lesion. Adrenals/Urinary Tract: Adrenal glands are unremarkable. No suspicious renal mass. No hydronephrosis. No renal or ureteric calculi. Unremarkable urinary bladder. Stomach/Bowel: No disproportionate dilation of the small or large bowel loops. No evidence of abnormal bowel wall thickening or  inflammatory changes. The appendix is unremarkable. Vascular/Lymphatic: No ascites or pneumoperitoneum. No abdominal or pelvic lymphadenopathy, by size criteria. No aneurysmal dilation of the major abdominal arteries. Reproductive: The uterus is unremarkable. The ovaries are unremarkable. Other: There is a tiny fat containing umbilical hernia. The soft tissues and abdominal wall are otherwise unremarkable. Musculoskeletal: No suspicious osseous lesions. IMPRESSION: 1. No acute findings in the abdomen or pelvis. 2. Mild hepatic steatosis. Electronically Signed   By: Jules Schick M.D.   On: 03/19/2023 13:17   US Abdomen Limited RUQ (LIVER/GB)  Result Date: 03/19/2023 CLINICAL DATA:  Right upper quadrant pain for 1 year. Increases after eating. EXAM: ULTRASOUND ABDOMEN LIMITED RIGHT UPPER QUADRANT COMPARISON:  None Available. FINDINGS: Gallbladder: No gallstones or wall thickening visualized. No sonographic Murphy sign noted by sonographer. Common bile duct: Diameter: Less than 4 mm.  No intrahepatic bile duct dilation. Liver: No focal lesion identified. Within normal limits in parenchymal echogenicity. Portal vein is patent on color Doppler imaging with normal direction of blood flow towards the liver. Other: None. IMPRESSION: *Normal right upper quadrant sonogram. Electronically Signed   By: Jules Schick M.D.   On: 03/19/2023 08:51    Assessment/ Plan: 37 y.o. female   Hepatic steatosis - Plan: Ambulatory referral to Gastroenterology  Umbilical hernia without obstruction and without gangrene  Rash and nonspecific skin eruption - Plan: Alpha-Gal Panel  Hepatic steatosis noted on CT scan but not commented on the right upper quadrant ultrasound.  Somewhat confounding as she is not obese.  We discussed increased fiber, whole foods, continuing physical exercise to reduce progression.  She did want to go ahead and see a gastroenterologist to further discuss and make sure that she is maximizing his  treatment plan to prevent any progression going forward.  Continue to abstain from alcohol etc.  Hernia is asymptomatic.  She is working on core strengthening exercises.  We discussed red flags and reasons for reevaluation.  Handout provided  Will collect alpha gal for patient.  ROI for both OB/GYN and dermatology completed today.  May follow-up in 1 year for annual physical with fasting labs, sooner if concerns arise  [name], DO [facility] [phone]

## 2023-04-30 LAB — ALPHA-GAL PANEL
Allergen Lamb IgE: <0.1 kU/L
Beef IgE: <0.1 kU/L
IgE (Immunoglobulin E), Serum: 72 [IU]/mL (ref 6–495)
O215-IgE Alpha-Gal: <0.1 kU/L
Pork IgE: <0.1 kU/L

## 2023-05-13 NOTE — Telephone Encounter (Signed)
Please see the MyChart message reply(ies) for my assessment and plan.    This patient gave consent for this Medical Advice Message and is aware that it may result in a bill to their insurance company, as well as the possibility of receiving a bill for a co-payment or deductible. They are an established patient, but are not seeking medical advice exclusively about a problem treated during an in person or video visit in the last seven days. I did not recommend an in person or video visit within seven days of my reply.    I spent a total of 5 minutes cumulative time within 7 days through MyChart messaging.  Breyana Follansbee, DO   

## 2023-05-17 DIAGNOSIS — J019 Acute sinusitis, unspecified: Secondary | ICD-10-CM | POA: Diagnosis not present

## 2023-05-19 ENCOUNTER — Other Ambulatory Visit: Payer: BC Managed Care – PPO

## 2023-05-19 DIAGNOSIS — L659 Nonscarring hair loss, unspecified: Secondary | ICD-10-CM

## 2023-05-19 DIAGNOSIS — R4586 Emotional lability: Secondary | ICD-10-CM

## 2023-05-28 ENCOUNTER — Encounter: Payer: Self-pay | Admitting: Internal Medicine

## 2023-05-28 ENCOUNTER — Ambulatory Visit: Payer: BC Managed Care – PPO | Admitting: Internal Medicine

## 2023-05-28 VITALS — BP 104/72 | HR 80 | Ht 64.0 in | Wt 142.0 lb

## 2023-05-28 DIAGNOSIS — R932 Abnormal findings on diagnostic imaging of liver and biliary tract: Secondary | ICD-10-CM | POA: Diagnosis not present

## 2023-05-28 DIAGNOSIS — R1013 Epigastric pain: Secondary | ICD-10-CM

## 2023-05-28 NOTE — Patient Instructions (Addendum)
As we discussed I am not convinced you have fatty liver problems. If you did you have done the right things by changing diet to reduce sugary beverages and processed foods. Please keep that up and follow-up with Dr. Nadine Counts as scheduled.  If you develop recurrent abdominal pain I can reassess you (and you can also see Dr. Nadine Counts.  I appreciate the opportunity to care for you. Iva Boop, MD, [facility]

## 2023-05-28 NOTE — Progress Notes (Signed)
Lindsey Clements 37 y.o. 02-May-1986 [id number]  Assessment & Plan:   Encounter Diagnoses  Name Primary?   Abnormal CT of liver - ? steatosis Yes   Abdominal pain, epigastric - resolved     CT scan suggested steatosis.  Ultrasound did not.  Cause of epigastric pain not clear though resolved.  She has made significant lifestyle changes specifically regarding diet and should continue those.  I think follow-up of LFTs next year is fine.  I would not repeat imaging unless she is having abdominal pain problems or lab abnormalities that warrant.  She was reassured and may follow-up here as needed.   CC: Lindsey Ip, DO   Subjective:   Chief Complaint:  HPI 37 year old white woman with a history of epigastric pain in the fall, was evaluated in the emergency department and by primary care.  On March 19, 2023 she had a normal right upper quadrant ultrasound.  No gallstones.  No abnormal liver consistency.  She was having postprandial epigastric pain.  Dull and gas-like.  It would last for several hours.  Labs from that evaluation showed an AST of 14 and ALT of 8.  Bilirubin normal alk phos normal.  Lipase normal.  CBC normal.  She followed up with primary care and a CT scan showed a tiny fat-containing umbilical hernia and changes consistent with hepatic steatosis, reported as mild.  Subsequently she stopped consuming significant amounts of sugary beverages, processed foods.  She feels better and has not had abdominal pain.  She wanted further evaluation of the suspected fatty liver so she was referred for consultation.  There is no significant or excessive alcohol consumption.  She had an alpha gal test done because she has some periorbital erythema which may be eczema but at any rate the alpha gal panel was negative.     Allergies  Allergen Reactions   Cephalexin Swelling    Throat swelling   Egg-Derived Products Anaphylaxis and Other (See Comments)    Stomach discomfort    Penicillins Swelling    Swelling of eyes and rash when she took it with mono  Thinks she may have had throat swelling   Current Meds  Medication Sig   Cetirizine HCl (ZYRTEC ALLERGY) 10 MG CAPS Take by mouth.   Past Medical History:  Diagnosis Date   Mild acne    Mononucleosis 05/2009   Rectocele affecting pregnancy    noted in prenatal but pt wants it on her EPIC chart that she has this problem   Scoliosis    Past Surgical History:  Procedure Laterality Date   MYRINGOTOMY     TONSILLECTOMY     WISDOM TOOTH EXTRACTION     Social History   Social History Narrative   Exercises regularly. Eats whole foods.     No ETOH, Tobacco or drug use   Christian faith. Ok with blood products if needed.   Married to Lindsey Clements. Has 2 children, a daughter Lindsey Clements and a son Lindsey Clements   Works as a Social worker   Lives in [place]   family history includes Alcohol abuse in her half-brother, maternal grandfather, maternal uncle, paternal grandfather, and paternal grandmother; Alzheimer's disease in her paternal grandfather; Breast cancer (age of onset: 46) in her maternal grandmother; Congestive Heart Failure in her maternal grandmother; Diabetes in her maternal grandmother; Emphysema in her paternal grandmother; Hyperlipidemia in her maternal grandfather and maternal grandmother; Hypertension in her mother; Thyroid disease in her mother.   Review of Systems As per HPI  also positive for seasonal allergies and sinus trouble, headaches.  Objective:   Physical Exam @BP  104/72   Pulse 80   Ht 5\' 4"  (1.626 m)   Wt 142 lb (64.4 kg)   LMP 04/15/2023   BMI 24.37 kg/m @  General:  NAD Eyes:   anicteric Lungs:  clear Heart::  S1S2 no rubs, murmurs or gallops Abdomen:  soft and nontender, BS+ Ext:   no edema, cyanosis or clubbing    Data Reviewed:  See HPI

## 2023-06-08 LAB — HORMONE PANEL (T4,TSH,FSH,TESTT,SHBG,DHEA,ETC)
DHEA-Sulfate, LCMS: 118 ug/dL
Estradiol, Serum, MS: 271 pg/mL
Estrone Sulfate: 348 ng/dL
Follicle Stimulating Hormone: 4.5 m[IU]/mL
Free T-3: 3.1 pg/mL
Free Testosterone, Serum: 1.6 pg/mL
Progesterone, Serum: 13 ng/dL
Sex Hormone Binding Globulin: 67.8 nmol/L
T4: 6.9 ug/dL
TSH: 1 uU/mL
Testosterone, Serum (Total): 16 ng/dL
Testosterone-% Free: 1 %
Triiodothyronine (T-3), Serum: 91 ng/dL

## 2023-06-17 ENCOUNTER — Encounter: Payer: Self-pay | Admitting: Nurse Practitioner

## 2023-06-17 ENCOUNTER — Telehealth: Payer: BC Managed Care – PPO | Admitting: Nurse Practitioner

## 2023-06-17 ENCOUNTER — Telehealth: Payer: Self-pay

## 2023-06-17 DIAGNOSIS — T3695XA Adverse effect of unspecified systemic antibiotic, initial encounter: Secondary | ICD-10-CM

## 2023-06-17 DIAGNOSIS — B379 Candidiasis, unspecified: Secondary | ICD-10-CM | POA: Diagnosis not present

## 2023-06-17 LAB — WET PREP FOR TRICH, YEAST, CLUE
Clue Cell Exam: NEGATIVE
Trichomonas Exam: NEGATIVE
Yeast Exam: POSITIVE — AB

## 2023-06-17 MED ORDER — FLUCONAZOLE 150 MG PO TABS: 150.0000 mg | ORAL_TABLET | Freq: Once | ORAL | 0 refills | Status: AC

## 2023-06-17 NOTE — Progress Notes (Signed)
 Virtual Visit via video Note Due to COVID-19 pandemic this visit was conducted virtually. This visit type was conducted due to national recommendations for restrictions regarding the COVID-19 Pandemic (e.g. social distancing, sheltering in place) in an effort to limit this patient's exposure and mitigate transmission in our community. All issues noted in this document were discussed and addressed.  A physical exam was not performed with this format.   I connected with Lindsey Clements on 06/17/2023 at 1358 by name and DOB and verified that I am speaking with the correct person using two identifiers. Lindsey Clements is currently located at home and  with her children   during visit. The provider, Anton Baton, DNP is located in their office at time of visit.  I discussed the limitations, risks, security and privacy concerns of performing an evaluation and management service by virtual visit and the availability of in person appointments. I also discussed with the patient that there may be a patient responsible charge related to this service. The patient expressed understanding and agreed to proceed.  Subjective:  Patient ID: Lindsey Clements, female    DOB: [birth year], 38 y.o.   MRN: [id number]  Chief Complaint:  Vaginal Itching (" Was on ATB and believe that she developed a yeast infection the ATB")   HPI: Lindsey Clements is a 38 y.o. female presenting on 06/17/2023 for Vaginal Itching (" Was on ATB and believe that she developed a yeast infection the ATB")   patient is a 38 year old female who presents via telehealth with complaints of vaginal itchiness, which started after completing a course of antibiotics. She reports no other significant symptoms but is concerned about the persistent itchiness. She was instructed to come to the clinic for a swab and further evaluation.  Relevant past medical, surgical, family, and social history reviewed and updated as indicated.  Allergies and medications  reviewed and updated.   Past Medical History:  Diagnosis Date   Mild acne    Mononucleosis 05/2009   Rectocele affecting pregnancy    noted in prenatal but pt wants it on her EPIC chart that she has this problem   Scoliosis     Past Surgical History:  Procedure Laterality Date   MYRINGOTOMY     TONSILLECTOMY     WISDOM TOOTH EXTRACTION      Social History   Socioeconomic History   Marital status: Married    Spouse name: [name]   Number of children: 2   Years of education: Not on file   Highest education level: Not on file  Occupational History   Occupation: hairdresser   Occupation: [employer]  Tobacco Use   Smoking status: Never   Smokeless tobacco: Never  Vaping Use   Vaping status: Never Used  Substance and Sexual Activity   Alcohol use: No    Alcohol/week: 0.0 standard drinks of alcohol   Drug use: No   Sexual activity: Not Currently    Comment: spouse vasectomy  Other Topics Concern   Not on file  Social History Narrative   Exercises regularly. Eats whole foods.     No ETOH, Tobacco or drug use   Christian faith. Ok with blood products if needed.   Married to [place]. 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   No facility-administered encounter medications on file as of 06/17/2023.    Allergies  Allergen Reactions   Cephalexin  Swelling    Throat swelling   Egg-Derived Products Anaphylaxis and Other (See Comments)    Stomach discomfort   Penicillins Swelling    Swelling of eyes and rash when she took  it with mono  Thinks she may have had throat swelling    Review of Systems  Constitutional:  Negative for activity change, appetite change and chills.  HENT:  Negative for sinus pain and sore throat.   Respiratory:  Negative for shortness of breath and wheezing.   Cardiovascular:  Negative for chest pain and leg swelling.  Gastrointestinal:  Negative for constipation, diarrhea, nausea and vomiting.  Genitourinary:  Positive for vaginal discharge. Negative for dyspareunia, dysuria and pelvic pain.       Itchiness  Neurological:  Negative for dizziness and headaches.      Observations/Objective: No vital signs or physical exam, this was a virtual health encounter.  Pt alert and oriented, answers all questions appropriately, and able to speak in full sentences.   Microscopic wet-mount exam shows excessive bacteria, white blood cells, yeast and epithelial cell.  Assessment and Plan: Lindsey Clements was seen today for vaginal itching.  Diagnoses and all orders for this visit:  Candidiasis -     WET PREP FOR TRICH, YEAST, CLUE  Antibiotic-induced yeast infection -     WET PREP FOR TRICH, YEAST, CLUE    Lindsey Clements is a 38 year old Caucasian female seen today via telehealth for possible yeast infection, no acute distress. No she came to the office for wet prep which was positive for yeast, she is being treated with Diflucan  150 mg once single dose, okay to repeat 1 dose in 72 hours if symptoms persist.  No intercourse until symptoms resolve .All question answered  Encourage healthy lifestyle choices, including diet (rich in fruits, vegetables, and lean proteins, and low in salt and simple carbohydrates) and exercise (at least 30 minutes of moderate physical activity daily).     The above assessment and management plan was discussed with the patient. The patient verbalized understanding of and has agreed to the management plan. Patient is aware to call the clinic if they develop any new  symptoms or if symptoms persist or worsen. Patient is aware when to return to the clinic for a follow-up visit. Patient educated on when it is appropriate to go to the emergency department.   Follow Up Instructions: No follow-ups on file.    I discussed the assessment and treatment plan with the patient. The patient was provided an opportunity to ask questions and all were answered. The patient agreed with the plan and demonstrated an understanding of the instructions.   The patient was advised to call back or seek an in-person evaluation if the symptoms worsen or if the condition fails to improve as anticipated.  The above assessment and management plan was discussed with the patient. The patient verbalized understanding of and has agreed to the management plan. Patient is aware to call the clinic if they develop any new symptoms or if symptoms persist or worsen. Patient is aware when to return to the clinic  for a follow-up visit. Patient educated on when it is appropriate to go to the emergency department.    I provided 12 minutes of time during this video encounter.   Lindsey Rupnow [place] Thompson, DNP [facility] [address] [place], [place] [address] [phone] 06/17/2023

## 2023-06-17 NOTE — Telephone Encounter (Signed)
 Copied from CRM [id number]. Topic: Clinical - Medical Advice >> Jun 17, 2023 11:27 AM Lindsey Clements wrote: Reason for CRM: Patient wants to know if something can be called in for yeast infection to [name] pharmacy in [place] [address], she was seen in December at urgent care and put on an antibiotic and has been itching down there every since which is usual for her after taking an antibiotic. Or if she needs an appointment.

## 2023-06-17 NOTE — Telephone Encounter (Signed)
 If she wants to send me a mychart note, I can do a mychart visit that way. Otherwise, she will have to be seen.

## 2023-07-29 ENCOUNTER — Other Ambulatory Visit: Payer: Self-pay

## 2023-07-29 MED ORDER — OMEPRAZOLE 20 MG PO CPDR: 20.0000 mg | DELAYED_RELEASE_CAPSULE | Freq: Every day | ORAL | 0 refills | Status: DC

## 2023-08-01 HISTORY — PX: ESOPHAGOGASTRODUODENOSCOPY: SHX1529

## 2023-08-04 ENCOUNTER — Ambulatory Visit: Payer: BC Managed Care – PPO | Admitting: Internal Medicine

## 2023-08-04 ENCOUNTER — Ambulatory Visit (INDEPENDENT_AMBULATORY_CARE_PROVIDER_SITE_OTHER): Payer: BC Managed Care – PPO | Admitting: Physician Assistant

## 2023-08-04 ENCOUNTER — Encounter: Payer: Self-pay | Admitting: Physician Assistant

## 2023-08-04 VITALS — BP 104/62 | HR 88 | Ht 64.0 in | Wt 134.4 lb

## 2023-08-04 DIAGNOSIS — K219 Gastro-esophageal reflux disease without esophagitis: Secondary | ICD-10-CM

## 2023-08-04 DIAGNOSIS — R0789 Other chest pain: Secondary | ICD-10-CM

## 2023-08-04 MED ORDER — OMEPRAZOLE 40 MG PO CPDR: 40.0000 mg | DELAYED_RELEASE_CAPSULE | Freq: Every day | ORAL | 5 refills | Status: DC

## 2023-08-04 MED ORDER — FAMOTIDINE 20 MG PO TABS: 20.0000 mg | ORAL_TABLET | Freq: Two times a day (BID) | ORAL | 5 refills | Status: DC

## 2023-08-04 NOTE — Patient Instructions (Signed)
 We have sent the following medications to your pharmacy for you to pick up at your convenience: Omeprazole 40 mg daily 30-60 minutes before breakfast. Pepcid 20 mg 1-2 times daily as needed.   _______________________________________________________  If your blood pressure at your visit was 140/90 or greater, please contact your primary care physician to follow up on this.  _______________________________________________________  If you are age 37 or older, your body mass index should be between 23-30. Your Body mass index is 23.07 kg/m. If this is out of the aforementioned range listed, please consider follow up with your Primary Care Provider.  If you are age 35 or younger, your body mass index should be between 19-25. Your Body mass index is 23.07 kg/m. If this is out of the aformentioned range listed, please consider follow up with your Primary Care Provider.   ________________________________________________________  The [facility] providers would like to encourage you to use [facility] to communicate with providers for non-urgent requests or questions.  Due to long hold times on the telephone, sending your provider a message by [facility] may be a faster and more efficient way to get a response.  Please allow 48 business hours for a response.  Please remember that this is for non-urgent requests.  _______________________________________________________

## 2023-08-04 NOTE — Progress Notes (Signed)
 Chief Complaint: GERD  HPI:    Lindsey Clements is a 38 year old female with a past medical history as below, known to Dr. Leone Payor, who was referred to me by [name], DO for a complaint of GERD.      03/19/2023 right upper quadrant ultrasound normal.  This was done for postprandial epigastric pain.    03/19/2023 CT scan with a tiny fat-containing umbilical hernia and changes consistent with hepatic steatosis reported as mild.    04/28/2023 alpha gal panel negative.    05/28/2023 patient saw Dr. Leone Payor for a CT scan suggesting steatosis, ultrasound did not, patient having some epigastric pain previously but had resolved.  Recommended repeat LFTs in a year.    Today, the patient presents to clinic accompanied by her young son.  She explains that over the past couple of weeks she has noticed some epigastric/chest pain that sometimes radiates through to her back and over to the left side of her chest, typically worse after eating.  Tells me all of the symptoms had gone away prior to seeing Dr. Leone Payor because she had altered her diet, she has slowly started back to eating some things like [employer] or drinking a caffeinated beverage and has noticed that these tend to bother her stomach.  Also notes that she is fairly stressed with her 2 young kids.  The other night this occurred and she felt like she may be having a heart attack so she called her mom who told her it was likely indigestion and she should try Omeprazole 20 mg which did help after about an hour.  That evening the pain seemed to come back when she was sleeping and she noticed if she stood up it seemed to radiate into her back.  She has continued on Omeprazole 20 mg daily in fact earlier last week Monday/Tuesday she did not have any symptoms at all so she stopped the medicine but then things came back.    Denies fever, chills, weight loss, blood in her stool or change in bowel habits.  Past Medical History:  Diagnosis Date   Mild  acne    Mononucleosis 05/2009   Rectocele affecting pregnancy    noted in prenatal but pt wants it on her EPIC chart that she has this problem   Scoliosis     Past Surgical History:  Procedure Laterality Date   MYRINGOTOMY     TONSILLECTOMY     WISDOM TOOTH EXTRACTION      Current Outpatient Medications  Medication Sig Dispense Refill   omeprazole (PRILOSEC) 20 MG capsule Take 1 capsule (20 mg total) by mouth daily. 90 capsule 0   Cetirizine HCl (ZYRTEC ALLERGY) 10 MG CAPS Take by mouth.     No current facility-administered medications for this visit.    Allergies as of 08/04/2023 - Review Complete 08/04/2023  Allergen Reaction Noted   Cephalexin Swelling 09/26/2013   Penicillins Swelling 08/30/2013    Family History  Problem Relation Age of Onset   Hypertension Mother    Thyroid disease Mother        hyperthyroid   Alcohol abuse Maternal Uncle    Hyperlipidemia Maternal Grandmother    Diabetes Maternal Grandmother    Breast cancer Maternal Grandmother 5   Congestive Heart Failure Maternal Grandmother    Alcohol abuse Maternal Grandfather    Hyperlipidemia Maternal Grandfather    Emphysema Paternal Grandmother    Alcohol abuse Paternal Grandmother    Alzheimer's disease Paternal Grandfather  Alcohol abuse Paternal Grandfather    Alcohol abuse Half-Brother    Miscarriages / Stillbirths Neg Hx     Social History   Socioeconomic History   Marital status: Married    Spouse name: Philippa Sicks   Number of children: 2   Years of education: Not on file   Highest education level: Not on file  Occupational History   Occupation: hairdresser   Occupation: [employer]  Tobacco Use   Smoking status: Never   Smokeless tobacco: Never  Vaping Use   Vaping status: Never Used  Substance and Sexual Activity   Alcohol use: No    Alcohol/week: 0.0 standard drinks of alcohol   Drug use: No   Sexual activity: Not Currently    Comment: spouse vasectomy  Other Topics Concern    Not on file  Social History Narrative   Exercises regularly. Eats whole foods.     No ETOH, Tobacco or drug use   Christian faith. Ok with blood products if needed.   Married to [place]. Has 2 children, a daughter [name] and a son [name]   Works as a Public affairs consultant in [place]   Social Drivers of Corporate investment banker Strain: Not on [employer] Insecurity: Not on file  Transportation Needs: Not on file  Physical Activity: Not on file  Stress: Not on file  Social Connections: Not on file  Intimate Partner Violence: Not on file    Review of Systems:    Constitutional: No weight loss, fever or chills Cardiovascular: No chest pain  Respiratory: No SOB  Gastrointestinal: See HPI and otherwise negative   Physical Exam:  Vital signs: BP 104/62   Pulse 88   Ht 5\' 4"  (1.626 m)   Wt 134 lb 6 oz (61 kg)   LMP 07/20/2023 (Within Weeks)   BMI 23.07 kg/m    Constitutional:   Very Pleasant Caucasian female appears to be in NAD, Well developed, Well nourished, alert and cooperative Respiratory: Respirations even and unlabored. Lungs clear to auscultation bilaterally.   No wheezes, crackles, or rhonchi.  Cardiovascular: Normal S1, S2. No MRG. Regular rate and rhythm. No peripheral edema, cyanosis or pallor.  Gastrointestinal:  Soft, nondistended, mild epigastric ttp, No rebound or guarding. Normal bowel sounds. No appreciable masses or hepatomegaly. Psychiatric: Oriented to person, place and time. Demonstrates good judgement and reason without abnormal affect or behaviors.  RELEVANT LABS AND IMAGING: CBC    Component Value Date/Time   WBC 5.1 03/19/2023 0740   RBC 4.77 03/19/2023 0740   HGB 13.5 03/19/2023 0740   HCT 38.9 03/19/2023 0740   PLT 291 03/19/2023 0740   MCV 81.6 03/19/2023 0740   MCH 28.3 03/19/2023 0740   MCHC 34.7 03/19/2023 0740   RDW 12.9 03/19/2023 0740   LYMPHSABS 1.8 10/26/2014 0837   MONOABS 0.3 10/26/2014 0837   EOSABS 0.1 10/26/2014 0837    BASOSABS 0.0 10/26/2014 0837    CMP     Component Value Date/Time   NA 140 03/19/2023 0740   K 3.8 03/19/2023 0740   CL 104 03/19/2023 0740   CO2 30 03/19/2023 0740   GLUCOSE 87 03/19/2023 0740   BUN 18 03/19/2023 0740   CREATININE 0.81 03/19/2023 0740   CREATININE 0.78 11/21/2016 1424   CALCIUM 9.5 03/19/2023 0740   PROT 7.2 03/19/2023 0740   ALBUMIN 4.5 03/19/2023 0740   AST 14 (L) 03/19/2023 0740   ALT 8 03/19/2023 0740   ALKPHOS 55 03/19/2023 0740  BILITOT 0.6 03/19/2023 0740   GFRNONAA >60 03/19/2023 0740    Assessment: 1.  GERD: Symptoms over the past month or so, some better with Meprazole 20 mg daily, increased stress from 2 young children and recent change in diet back to some greasy foods and caffeine; likely gastritis 2.  Atypical chest pain: With above  Plan: 1.  Increased Omeprazole to 40 mg once daily, 30-60 minutes before breakfast.  #30 with 5 refills 2.  Prescribed Pepcid 20 mg 1-2 tabs as needed for breakthrough symptoms during the day #30 with 5 refills 3.  Discussed with patient that if after a couple of weeks she does nit notice a  change in symptoms I would increase to 40 mg twice a day for a while 4.  Patient to follow-up in 2 to 3 months.  If things are not any better could consider an EGD. 5.  Also provided the patient an antireflux diet and lifestyle modifications handout.  Hyacinth Meeker, PA-C [facility] 08/04/2023, 9:07 AM  Cc: [name], DO

## 2023-08-17 DIAGNOSIS — R1013 Epigastric pain: Secondary | ICD-10-CM

## 2023-08-17 DIAGNOSIS — R0789 Other chest pain: Secondary | ICD-10-CM

## 2023-08-17 DIAGNOSIS — K219 Gastro-esophageal reflux disease without esophagitis: Secondary | ICD-10-CM

## 2023-08-18 ENCOUNTER — Other Ambulatory Visit: Payer: Self-pay

## 2023-08-18 DIAGNOSIS — R1013 Epigastric pain: Secondary | ICD-10-CM

## 2023-08-18 NOTE — Telephone Encounter (Signed)
 Lindsey Clements see the message regarding Celiac testing from the pt.

## 2023-08-18 NOTE — Addendum Note (Signed)
 Addended by: Loretha Stapler on: 08/18/2023 12:41 PM   Modules accepted: Orders

## 2023-08-19 NOTE — Telephone Encounter (Signed)
 Lindsey Clements see additional questions above from pt.

## 2023-08-20 ENCOUNTER — Other Ambulatory Visit

## 2023-08-20 DIAGNOSIS — R1013 Epigastric pain: Secondary | ICD-10-CM | POA: Diagnosis not present

## 2023-08-21 DIAGNOSIS — K219 Gastro-esophageal reflux disease without esophagitis: Secondary | ICD-10-CM | POA: Diagnosis not present

## 2023-08-21 DIAGNOSIS — R0789 Other chest pain: Secondary | ICD-10-CM | POA: Diagnosis not present

## 2023-08-21 DIAGNOSIS — R1013 Epigastric pain: Secondary | ICD-10-CM | POA: Diagnosis not present

## 2023-08-22 LAB — IGA: Immunoglobulin A: 197 mg/dL (ref 47–310)

## 2023-08-22 LAB — TISSUE TRANSGLUTAMINASE ABS,IGG,IGA
(tTG) Ab, IgA: <1 U/mL
(tTG) Ab, IgG: <1 U/mL

## 2023-08-24 ENCOUNTER — Encounter: Payer: Self-pay | Admitting: Certified Registered Nurse Anesthetist

## 2023-08-24 ENCOUNTER — Other Ambulatory Visit: Payer: Self-pay | Admitting: *Deleted

## 2023-08-24 DIAGNOSIS — R1013 Epigastric pain: Secondary | ICD-10-CM

## 2023-08-24 DIAGNOSIS — K219 Gastro-esophageal reflux disease without esophagitis: Secondary | ICD-10-CM

## 2023-08-24 DIAGNOSIS — R0789 Other chest pain: Secondary | ICD-10-CM

## 2023-08-24 DIAGNOSIS — R932 Abnormal findings on diagnostic imaging of liver and biliary tract: Secondary | ICD-10-CM

## 2023-08-25 ENCOUNTER — Other Ambulatory Visit: Payer: Self-pay | Admitting: Physician Assistant

## 2023-08-25 NOTE — Progress Notes (Signed)
 08/25/23 8:28 AM  Received lab results from H. pylori testing via [facility] it was negative.  Hyacinth Meeker, PA-C

## 2023-08-25 NOTE — Telephone Encounter (Signed)
 No I think we are all set.

## 2023-08-26 ENCOUNTER — Telehealth: Payer: Self-pay | Admitting: *Deleted

## 2023-08-26 NOTE — Telephone Encounter (Signed)
-----   Message from Unk Lightning sent at 08/25/2023  8:29 AM EDT ----- Regarding: H. pylori testing Please call patient and let her know that H. pylori testing was negative.  I think we have her set up for an EGD which is the next step.  Thanks, JL L

## 2023-08-27 ENCOUNTER — Ambulatory Visit: Admitting: Gastroenterology

## 2023-08-27 ENCOUNTER — Encounter: Payer: Self-pay | Admitting: Gastroenterology

## 2023-08-27 VITALS — BP 114/76 | HR 59 | Temp 98.2°F | Resp 12 | Ht 64.0 in | Wt 134.6 lb

## 2023-08-27 DIAGNOSIS — K449 Diaphragmatic hernia without obstruction or gangrene: Secondary | ICD-10-CM

## 2023-08-27 DIAGNOSIS — R0789 Other chest pain: Secondary | ICD-10-CM

## 2023-08-27 DIAGNOSIS — K219 Gastro-esophageal reflux disease without esophagitis: Secondary | ICD-10-CM | POA: Diagnosis not present

## 2023-08-27 MED ORDER — SODIUM CHLORIDE 0.9 % IV SOLN: 500.0000 mL | INTRAVENOUS | Status: DC

## 2023-08-27 MED ORDER — PANTOPRAZOLE SODIUM 40 MG PO TBEC
40.0000 mg | DELAYED_RELEASE_TABLET | Freq: Two times a day (BID) | ORAL | 0 refills | Status: DC
Start: 2023-08-27 — End: 2023-11-23

## 2023-08-27 NOTE — Progress Notes (Signed)
 [facility] History and Physical   Primary Care Physician:  [name], DO   Reason for Procedure:  Atypical chest pain, GERD  Plan:    EGD  with possible interventions as needed     HPI: Lindsey Clements is a very pleasant 38 y.o. female here for EGD for Atypical chest pain, GERD.   The risks and benefits as well as alternatives of endoscopic procedure(s) have been discussed and reviewed. All questions answered. The patient agrees to proceed.    Past Medical History:  Diagnosis Date   Mild acne    Mononucleosis 05/2009   Rectocele affecting pregnancy    noted in prenatal but pt wants it on her EPIC chart that she has this problem   Scoliosis     Past Surgical History:  Procedure Laterality Date   MYRINGOTOMY     TONSILLECTOMY     WISDOM TOOTH EXTRACTION      Prior to Admission medications   Medication Sig Start Date End Date Taking? Authorizing Provider  omeprazole (PRILOSEC) 40 MG capsule Take 1 capsule (40 mg total) by mouth daily. Patient taking differently: Take 80 mg by mouth daily. 08/04/23  Yes Unk Lightning, PA  EPINEPHrine (AUVI-Q) 0.3 mg/0.3 mL IJ SOAJ injection Inject 0.3 mg into the muscle as needed. Patient not taking: Reported on 08/27/2023    [provider]  famotidine (PEPCID) 20 MG tablet Take 1 tablet (20 mg total) by mouth 2 (two) times daily. Patient not taking: Reported on 08/27/2023 08/04/23   Unk Lightning, PA    Current Outpatient Medications  Medication Sig Dispense Refill   omeprazole (PRILOSEC) 40 MG capsule Take 1 capsule (40 mg total) by mouth daily. (Patient taking differently: Take 80 mg by mouth daily.) 30 capsule 5   EPINEPHrine (AUVI-Q) 0.3 mg/0.3 mL IJ SOAJ injection Inject 0.3 mg into the muscle as needed. (Patient not taking: Reported on 08/27/2023)     famotidine (PEPCID) 20 MG tablet Take 1 tablet (20 mg total) by mouth 2 (two) times daily. (Patient not taking: Reported on 08/27/2023) 30  tablet 5   Current Facility-Administered Medications  Medication Dose Route Frequency Provider Last Rate Last Admin   0.9 %  sodium chloride infusion  500 mL Intravenous Continuous Demetri Kerman, Eleonore Chiquito, MD        Allergies as of 08/27/2023 - Review Complete 08/27/2023  Allergen Reaction Noted   Cephalexin Swelling 09/26/2013   Penicillins Swelling 08/30/2013    Family History  Problem Relation Age of Onset   Hypertension Mother    Thyroid disease Mother        hyperthyroid   Alcohol abuse Maternal Uncle    Hyperlipidemia Maternal Grandmother    Diabetes Maternal Grandmother    Breast cancer Maternal Grandmother 68   Congestive Heart Failure Maternal Grandmother    Alcohol abuse Maternal Grandfather    Hyperlipidemia Maternal Grandfather    Emphysema Paternal Grandmother    Alcohol abuse Paternal Grandmother    Alzheimer's disease Paternal Grandfather    Alcohol abuse Paternal Grandfather    Alcohol abuse Half-Brother    Miscarriages / Stillbirths Neg Hx    Colon cancer Neg Hx    Colon polyps Neg Hx    Esophageal cancer Neg Hx    Rectal cancer Neg Hx    Stomach cancer Neg Hx     Social History   Socioeconomic History   Marital status: Married    Spouse name: Philippa Sicks   Number of children: 2  Years of education: Not on file   Highest education level: Not on file  Occupational History   Occupation: hairdresser   Occupation: [employer]  Tobacco Use   Smoking status: Never   Smokeless tobacco: Never  Vaping Use   Vaping status: Never Used  Substance and Sexual Activity   Alcohol use: No    Alcohol/week: 0.0 standard drinks of alcohol   Drug use: No   Sexual activity: Not Currently    Comment: spouse vasectomy  Other Topics Concern   Not on file  Social History Narrative   Exercises regularly. Eats whole foods.     No ETOH, Tobacco or drug use   Christian faith. Ok with blood products if needed.   Married to [place]. 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  Physical Exam: Vital signs in last 24 hours: BP 126/84   Pulse 73   Temp 98.2 F (36.8 C) (Skin)   Resp 11   Ht 5\' 4"  (1.626 m)   Wt 134 lb 9.6 oz (61.1 kg)   LMP 07/20/2023 (Within Weeks)   SpO2 100%   BMI 23.10 kg/m  General:   Alert, NAD Lungs:  Clear .   Heart:  Regular rate and rhythm Abdomen:  Soft, nontender and nondistended. Neuro/Psych:  Alert and cooperative. Normal mood and affect. A and O x 3  Reviewed labs, radiology imaging, old records and pertinent past GI work up  Patient is appropriate for planned procedure(s) and anesthesia in an ambulatory setting   K. Scherry Ran , MD [phone]

## 2023-08-27 NOTE — Progress Notes (Signed)
0956 Robinul 0.1 mg IV given due large amount of secretions upon assessment.  MD made aware, vss  

## 2023-08-27 NOTE — Patient Instructions (Addendum)
 -Handout on hiatal hernia provided -await pathology results   -Continue present medications   -Use Protonix 40 mg by mouth 2 times a day for 3 months. Then, start taking 40 mg once a day.  -Follow up appointment in GI office    YOU HAD AN ENDOSCOPIC PROCEDURE TODAY AT THE [facility]:   Refer to the procedure report that was given to you for any specific questions about what was found during the examination.  If the procedure report does not answer your questions, please call your gastroenterologist to clarify.  If you requested that your care partner not be given the details of your procedure findings, then the procedure report has been included in a sealed envelope for you to review at your convenience later.  YOU SHOULD EXPECT: Some feelings of bloating in the abdomen. Passage of more gas than usual.  Walking can help get rid of the air that was put into your GI tract during the procedure and reduce the bloating. If you had a lower endoscopy (such as a colonoscopy or flexible sigmoidoscopy) you may notice spotting of blood in your stool or on the toilet paper. If you underwent a bowel prep for your procedure, you may not have a normal bowel movement for a few days.  Please Note:  You might notice some irritation and congestion in your nose or some drainage.  This is from the oxygen used during your procedure.  There is no need for concern and it should clear up in a day or so.  SYMPTOMS TO REPORT IMMEDIATELY:  Following upper endoscopy (EGD)  Vomiting of blood or coffee ground material  New chest pain or pain under the shoulder blades  Painful or persistently difficult swallowing  New shortness of breath  Fever of 100F or higher  Black, tarry-looking stools  For urgent or emergent issues, a gastroenterologist can be reached at any hour by calling [phone]. Do not use MyChart messaging for urgent concerns.    DIET:  We do recommend a small meal at first, but then you  may proceed to your regular diet.  Drink plenty of fluids but you should avoid alcoholic beverages for 24 hours.  ACTIVITY:  You should plan to take it easy for the rest of today and you should NOT DRIVE or use heavy machinery until tomorrow (because of the sedation medicines used during the test).    FOLLOW UP: Our staff will call the number listed on your records the next business day following your procedure.  We will call around 7:15- 8:00 am to check on you and address any questions or concerns that you may have regarding the information given to you following your procedure. If we do not reach you, we will leave a message.     If any biopsies were taken you will be contacted by phone or by letter within the next 1-3 weeks.  Please call us at [phone] if you have not heard about the biopsies in 3 weeks.    SIGNATURES/CONFIDENTIALITY: You and/or your care partner have signed paperwork which will be entered into your electronic medical record.  These signatures attest to the fact that that the information above on your After Visit Summary has been reviewed and is understood.  Full responsibility of the confidentiality of this discharge information lies with you and/or your care-partner.      Hiatal Hernia  A hiatal hernia occurs when part of the stomach slides above the muscle that separates the  abdomen from the chest (diaphragm). A person can be born with a hiatal hernia (congenital), or it may develop over time. In almost all cases of hiatal hernia, only the top part of the stomach pushes through the diaphragm. Many people have a hiatal hernia with no symptoms. The larger the hernia, the more likely it is that you will have symptoms. In some cases, a hiatal hernia allows stomach acid to flow back into the tube that carries food from your mouth to your stomach (esophagus). This may cause heartburn symptoms. The development of heartburn symptoms may mean that you have a condition called  gastroesophageal reflux disease (GERD). What are the causes? This condition is caused by a weakness in the opening (hiatus) where the esophagus passes through the diaphragm to attach to the upper part of the stomach. A person may be born with a weakness in the hiatus, or a weakness can develop over time. What increases the risk? This condition is more likely to develop in: Older people. Age is a major risk factor for a hiatal hernia, especially if you are over the age of 34. Pregnant women. People who are overweight. People who have frequent constipation. What are the signs or symptoms? Symptoms of this condition usually develop in the form of GERD symptoms. Symptoms include: Heartburn. Upset stomach (indigestion). Trouble swallowing. Coughing or wheezing. Wheezing is making high-pitched whistling sounds when you breathe. Sore throat. Chest pain. Nausea and vomiting. How is this diagnosed? This condition may be diagnosed during testing for GERD. Tests that may be done include: X-rays of your stomach or chest. An upper gastrointestinal (GI) series. This is an X-ray exam of your GI tract that is taken after you swallow a chalky liquid that shows up clearly on the X-ray. Endoscopy. This is a procedure to look into your stomach using a thin, flexible tube that has a tiny camera and light on the end of it. How is this treated? This condition may be treated by: Dietary and lifestyle changes to help reduce GERD symptoms. Medicines. These may include: Over-the-counter antacids. Medicines that make your stomach empty more quickly. Medicines that block the production of stomach acid (H2 blockers). Stronger medicines to reduce stomach acid (proton pump inhibitors). Surgery to repair the hernia, if other treatments are not helping. If you have no symptoms, you may not need treatment. Follow these instructions at home: Lifestyle and activity Do not use any products that contain nicotine or  tobacco. These products include cigarettes, chewing tobacco, and vaping devices, such as e-cigarettes. If you need help quitting, ask your health care provider. Try to achieve and maintain a healthy body weight. Avoid putting pressure on your abdomen. Anything that puts pressure on your abdomen increases the amount of acid that may be pushed up into your esophagus. Avoid bending over, especially after eating. Raise the head of your bed by putting blocks under the legs. This keeps your head and esophagus higher than your stomach. Do not wear tight clothing around your chest or stomach. Try not to strain when having a bowel movement, when urinating, or when lifting heavy objects. Eating and drinking Avoid foods that can worsen GERD symptoms. These may include: Fatty foods, like fried foods. Citrus fruits, like oranges or lemon. Other foods and drinks that contain acid, like orange juice or tomatoes. Spicy food. Chocolate. Eat frequent small meals instead of three large meals a day. This helps prevent your stomach from getting too full. Eat slowly. Do not lie down right after  eating. Do not eat 1-2 hours before bed. Do not drink beverages with caffeine. These include cola, coffee, cocoa, and tea. Do not drink alcohol. General instructions Take over-the-counter and prescription medicines only as told by your health care provider. Keep all follow-up visits. Your health care provider will want to check that any new prescribed medicines are helping your symptoms. Contact a health care provider if: Your symptoms are not controlled with medicines or lifestyle changes. You are having trouble swallowing. You have coughing or wheezing that will not go away. Your pain is getting worse. Your pain spreads to your arms, neck, jaw, teeth, or back. You feel nauseous or you vomit. Get help right away if: You have shortness of breath. You vomit blood. You have bright red blood in your stools. You have  black, tarry stools. These symptoms may be an emergency. Get help right away. Call 911. Do not wait to see if the symptoms will go away. Do not drive yourself to the hospital. Summary A hiatal hernia occurs when part of the stomach slides above the muscle that separates the abdomen from the chest. A person may be born with a weakness in the hiatus, or a weakness can develop over time. Symptoms of a hiatal hernia may include heartburn, trouble swallowing, or sore throat. Management of a hiatal hernia includes eating frequent small meals instead of three large meals a day. Get help right away if you vomit blood, have bright red blood in your stools, or have black, tarry stools. This information is not intended to replace advice given to you by your health care provider. Make sure you discuss any questions you have with your health care provider. Document Revised: 07/16/2021 Document Reviewed: 07/16/2021 Elsevier Patient Education  2024 [employer].

## 2023-08-27 NOTE — Progress Notes (Signed)
 Report given to PACU, vss

## 2023-08-27 NOTE — Progress Notes (Signed)
 Called to room to assist during endoscopic procedure.  Patient ID and intended procedure confirmed with present staff. Received instructions for my participation in the procedure from the performing physician.

## 2023-08-27 NOTE — Op Note (Signed)
 [facility] Patient Name: Lindsey Clements Procedure Date: 08/27/2023 9:49 AM MRN: [id number] Endoscopist: Napoleon Form , MD, [id number] Age: 38 Referring MD:  Date of Birth: 02-Nov-1985 Gender: Female Account #: [id number] Procedure:                Upper GI endoscopy Indications:              Esophageal reflux symptoms that persist despite                            appropriate therapy, Chest pain (non cardiac) Medicines:                Monitored Anesthesia Care Procedure:                Pre-Anesthesia Assessment:                           - Prior to the procedure, a History and Physical                            was performed, and patient medications and                            allergies were reviewed. The patient's tolerance of                            previous anesthesia was also reviewed. The risks                            and benefits of the procedure and the sedation                            options and risks were discussed with the patient.                            All questions were answered, and informed consent                            was obtained. Prior Anticoagulants: The patient has                            taken no anticoagulant or antiplatelet agents. ASA                            Grade Assessment: II - A patient with mild systemic                            disease. After reviewing the risks and benefits,                            the patient was deemed in satisfactory condition to                            undergo the procedure.  After obtaining informed consent, the endoscope was                            passed under direct vision. Throughout the                            procedure, the patient's blood pressure, pulse, and                            oxygen saturations were monitored continuously. The                            Olympus Scope [id number] was introduced through the                             mouth, and advanced to the second part of duodenum.                            The upper GI endoscopy was accomplished without                            difficulty. The patient tolerated the procedure                            well. Scope In: Scope Out: Findings:                 LA Grade B (one or more mucosal breaks greater than                            5 mm, not extending between the tops of two mucosal                            folds) esophagitis with no bleeding was found 38 to                            40 cm from the incisors. Biopsies were obtained                            from the proximal and distal esophagus with cold                            forceps for histology of suspected eosinophilic                            esophagitis.                           There were esophageal mucosal changes suspicious                            for short-segment Barrett's esophagus present in  the lower third of the esophagus. The maximum                            longitudinal extent of these mucosal changes was 2                            cm in length. Mucosa was biopsied with a cold                            forceps for histology in a targeted manner from 38                            to 40 cm from the incisors. One specimen bottle was                            sent to pathology.                           A 1 cm hiatal hernia was present.                           The stomach was normal.                           The cardia and gastric fundus were normal on                            retroflexion.                           The examined duodenum was normal. Complications:            No immediate complications. Estimated Blood Loss:     Estimated blood loss was minimal. Impression:               - LA Grade B reflux esophagitis with no bleeding.                           - Esophageal mucosal changes suspicious for                            short-segment  Barrett's esophagus. Biopsied.                           - 1 cm hiatal hernia.                           - Normal stomach.                           - Normal examined duodenum.                           - Biopsies were taken with a cold forceps for  evaluation of eosinophilic esophagitis. Recommendation:           - Patient has a contact number available for                            emergencies. The signs and symptoms of potential                            delayed complications were discussed with the                            patient. Return to normal activities tomorrow.                            Written discharge instructions were provided to the                            patient.                           - Resume previous diet.                           - Continue present medications.                           - Await pathology results.                           - Use Protonix (pantoprazole) 40 mg PO BID for 3                            months followed by 40mg  daily                           - Follow up in GI office next available appointment                            in 3-4 months Napoleon Form, MD 08/27/2023 10:22:04 AM This report has been signed electronically.

## 2023-08-27 NOTE — Telephone Encounter (Signed)
 Patient informed.

## 2023-08-28 ENCOUNTER — Telehealth: Payer: Self-pay

## 2023-08-28 NOTE — Telephone Encounter (Signed)
Attempted to reach patient for post-procedure f/u call. No answer. Left message for her to please not hesitate to call if she has any questions/concerns regarding her care. 

## 2023-08-31 ENCOUNTER — Encounter: Payer: Self-pay | Admitting: Gastroenterology

## 2023-08-31 LAB — SURGICAL PATHOLOGY

## 2023-09-02 ENCOUNTER — Encounter: Payer: Self-pay | Admitting: Physician Assistant

## 2023-09-08 ENCOUNTER — Telehealth (INDEPENDENT_AMBULATORY_CARE_PROVIDER_SITE_OTHER): Admitting: Nurse Practitioner

## 2023-09-08 ENCOUNTER — Encounter: Payer: Self-pay | Admitting: Nurse Practitioner

## 2023-09-08 DIAGNOSIS — R051 Acute cough: Secondary | ICD-10-CM | POA: Diagnosis not present

## 2023-09-08 DIAGNOSIS — J309 Allergic rhinitis, unspecified: Secondary | ICD-10-CM

## 2023-09-08 DIAGNOSIS — R0981 Nasal congestion: Secondary | ICD-10-CM | POA: Diagnosis not present

## 2023-09-08 DIAGNOSIS — J3489 Other specified disorders of nose and nasal sinuses: Secondary | ICD-10-CM | POA: Diagnosis not present

## 2023-09-08 MED ORDER — FEXOFENADINE HCL 180 MG PO TABS: 180.0000 mg | ORAL_TABLET | Freq: Every day | ORAL | 0 refills | Status: DC

## 2023-09-08 MED ORDER — AZELASTINE HCL 0.1 % NA SOLN: 1.0000 | Freq: Two times a day (BID) | NASAL | 12 refills | Status: DC

## 2023-09-08 NOTE — Progress Notes (Signed)
 Virtual Visit via video Note Due to COVID-19 pandemic this visit was conducted virtually. This visit type was conducted due to national recommendations for restrictions regarding the COVID-19 Pandemic (e.g. social distancing, sheltering in place) in an effort to limit this patient's exposure and mitigate transmission in our community. All issues noted in this document were discussed and addressed.  A physical exam was not performed with this format.   I connected with Lindsey Clements on 09/08/2023 0820 and  by Name and DOB and verified that I am speaking with the correct person using two identifiers. Lindsey Clements is currently located at home during visit. The provider, Martina Sinner, DNP is located in their office at time of visit.  I discussed the limitations, risks, security and privacy concerns of performing an evaluation and management service by virtual visit and the availability of in person appointments. I also discussed with the patient that there may be a patient responsible charge related to this service. The patient expressed understanding and agreed to proceed.  Subjective:  Patient ID: Lindsey Clements, female    DOB: [birth year], 38 y.o.   MRN: [id number]  Chief Complaint:  Nasal Congestion ("cough started Saturday, better today")   HPI: Lindsey Clements is a 38 y.o. female presenting on 09/08/2023 for Nasal Congestion ("cough started Saturday, better today")  " I think I have a sinus infection, Saturday I lost my my voice and was congested blowing yellow stuff out of my nose and my head is congested .  I took over the counter Non drowsy Sudafed and it seems to help". Report cough has subsided, voice is back. Denies fever, N/V. Reports history of allergic rhinitis. " Despite blowing my nose and getting yellow mucus, you think it is sinusitis".   Relevant past medical, surgical, family, and social history reviewed and updated as indicated.  Allergies and medications reviewed and  updated.   Past Medical History:  Diagnosis Date   Mild acne    Mononucleosis 05/2009   Rectocele affecting pregnancy    noted in prenatal but pt wants it on her EPIC chart that she has this problem   Scoliosis     Past Surgical History:  Procedure Laterality Date   MYRINGOTOMY     TONSILLECTOMY     WISDOM TOOTH EXTRACTION      Social History   Socioeconomic History   Marital status: Married    Spouse name: Philippa Sicks   Number of children: 2   Years of education: Not on file   Highest education level: Not on file  Occupational History   Occupation: hairdresser   Occupation: [employer]  Tobacco Use   Smoking status: Never   Smokeless tobacco: Never  Vaping Use   Vaping status: Never Used  Substance and Sexual Activity   Alcohol use: No    Alcohol/week: 0.0 standard drinks of alcohol   Drug use: No   Sexual activity: Not Currently    Comment: spouse vasectomy  Other Topics Concern   Not on file  Social History Narrative   Exercises regularly. Eats whole foods.     No ETOH, Tobacco or drug use   Christian faith. Ok with blood products if needed.   Married to [place]. Has 2 children, a daughter [name] and a son [name]   Works as a Public affairs consultant in [place]   Social Drivers of Corporate investment banker Strain: Not on [employer] Insecurity: Not on file  Transportation Needs: Not  on file  Physical Activity: Not on file  Stress: Not on file  Social Connections: Not on file  Intimate Partner Violence: Not on file    Outpatient Encounter Medications as of 09/08/2023  Medication Sig   azelastine (ASTELIN) 0.1 % nasal spray Place 1 spray into both nostrils 2 (two) times daily. Use in each nostril as directed   fexofenadine (ALLEGRA ALLERGY) 180 MG tablet Take 1 tablet (180 mg total) by mouth daily.   pantoprazole (PROTONIX) 40 MG tablet Take 1 tablet (40 mg total) by mouth 2 (two) times daily.   [DISCONTINUED] EPINEPHrine (AUVI-Q) 0.3 mg/0.3 mL IJ SOAJ  injection Inject 0.3 mg into the muscle as needed. (Patient not taking: Reported on 08/27/2023)   [DISCONTINUED] famotidine (PEPCID) 20 MG tablet Take 1 tablet (20 mg total) by mouth 2 (two) times daily. (Patient not taking: Reported on 08/27/2023)   [DISCONTINUED] omeprazole (PRILOSEC) 40 MG capsule Take 1 capsule (40 mg total) by mouth daily. (Patient taking differently: Take 80 mg by mouth daily.)   No facility-administered encounter medications on file as of 09/08/2023.    Allergies  Allergen Reactions   Cephalexin Swelling    Throat swelling   Penicillins Swelling    Swelling of eyes and rash when she took it with mono  Thinks she may have had throat swelling    Review of Systems  HENT:  Positive for congestion. Negative for ear pain, sinus pressure, sinus pain, sore throat and trouble swallowing.        "Loss her voice on Saturday, but voice is back to normal"  "Yellow mucus from nose"  Respiratory:  Positive for cough. Negative for apnea.        " Mainly at night"  Cardiovascular:  Negative for chest pain and leg swelling.  Skin:  Negative for pallor and rash.  Neurological:  Negative for dizziness and headaches.    Observations/Objective: No vital signs or physical exam, this was a virtual health encounter.  Pt alert and oriented, answers all questions appropriately, and able to speak in full sentences.  Physical Exam Constitutional:      Appearance: Normal appearance.  HENT:     Head: Normocephalic and atraumatic.     Nose: Congestion present.  Eyes:     Extraocular Movements: Extraocular movements intact.     Conjunctiva/sclera: Conjunctivae normal.     Pupils: Pupils are equal, round, and reactive to light.  Pulmonary:     Effort: Pulmonary effort is normal.  Skin:    General: Skin is warm and dry.     Findings: No rash.  Neurological:     Mental Status: She is alert and oriented to person, place, and time.  Psychiatric:        Mood and Affect: Mood normal.         Behavior: Behavior normal.        Thought Content: Thought content normal.        Judgment: Judgment normal.      Assessment and Plan: Lindsey Clements was seen today for nasal congestion.  Diagnoses and all orders for this visit:  Nasal congestion with rhinorrhea -     azelastine (ASTELIN) 0.1 % nasal spray; Place 1 spray into both nostrils 2 (two) times daily. Use in each nostril as directed  Acute cough -     fexofenadine (ALLEGRA ALLERGY) 180 MG tablet; Take 1 tablet (180 mg total) by mouth daily.  Allergic rhinitis, unspecified seasonality, unspecified trigger -     fexofenadine (  ALLEGRA ALLERGY) 180 MG tablet; Take 1 tablet (180 mg total) by mouth daily. -     azelastine (ASTELIN) 0.1 % nasal spray; Place 1 spray into both nostrils 2 (two) times daily. Use in each nostril as directed   Lindsey Clements is a 38 yrs old Caucasian female, seen today via telehealth for nasal congestion, no acute distress Nasal congestion: Astelin BID Allergic rhinitis: Allegra 1-tab daily Increase hydration -Lack of antibiotic effectiveness discussed with her.  Lindsey Clements is instructed to call the clinic if not feeling by Friday  Call or return to clinic prn if these symptoms worsen or fail to improve as anticipated.  Follow Up Instructions: Return if symptoms worsen or fail to improve.    I discussed the assessment and treatment plan with the patient. The patient was provided an opportunity to ask questions and all were answered. The patient agreed with the plan and demonstrated an understanding of the instructions.   The patient was advised to call back or seek an in-person evaluation if the symptoms worsen or if the condition fails to improve as anticipated.  The above assessment and management plan was discussed with the patient. The patient verbalized understanding of and has agreed to the management plan. Patient is aware to call the clinic if they develop any new symptoms or if symptoms persist or  worsen. Patient is aware when to return to the clinic for a follow-up visit. Patient educated on when it is appropriate to go to the emergency department.    I provided 12 minutes of time during this video encounter.   Lindsey Aran Santa Lighter, DNP [facility] [address] [place], [place] [address] [phone] 09/08/2023

## 2023-09-09 ENCOUNTER — Telehealth: Payer: Self-pay

## 2023-09-09 NOTE — Telephone Encounter (Signed)
 Copied from CRM [id number]. Topic: Clinical - Medication Question >> Sep 09, 2023  4:10 PM Saverio Danker wrote: Reason for CRM: Patient is calling in to know if an antibiotic can be prescribed. She states her Dr told her if she is not feeling any better to call in and get that done.  Patient would like a call back if possible to get the antibiotic in

## 2023-09-10 ENCOUNTER — Other Ambulatory Visit: Payer: Self-pay | Admitting: Nurse Practitioner

## 2023-09-10 DIAGNOSIS — J3489 Other specified disorders of nose and nasal sinuses: Secondary | ICD-10-CM

## 2023-09-10 MED ORDER — AZITHROMYCIN 250 MG PO TABS: ORAL_TABLET | ORAL | 0 refills | Status: DC

## 2023-09-10 NOTE — Progress Notes (Signed)
 Leonette reports that she is not feeling better, zpack 250 mg # 6 sent to her pharmacy

## 2023-09-15 ENCOUNTER — Telehealth: Payer: Self-pay

## 2023-09-15 ENCOUNTER — Other Ambulatory Visit: Payer: Self-pay

## 2023-09-15 MED ORDER — SUCRALFATE 1 G PO TABS: 1.0000 g | ORAL_TABLET | Freq: Three times a day (TID) | ORAL | 0 refills | Status: DC

## 2023-09-15 NOTE — Telephone Encounter (Signed)
 Let's add carafate suspension ACHS for a few weeks in addition to her pantoprazole.  She has an appt with Bridgette [facility] in May and should keep that as well.  Thank you,  Jess    Agree w/ PA recommendations above  But would do tablets 1 g qac tid and hs as suspension is not covered and expensive  Also - Jennifer's note mentioned that the patient was under stress - if so that could be contributing to these symptoms and she should seek help from PCP or other appropriate provider  Symptoms are real but could be coming from or amplified by stress

## 2023-09-15 NOTE — Telephone Encounter (Signed)
 Information shared with the patient. See the patient message starting 08/31/23.

## 2023-09-15 NOTE — Telephone Encounter (Signed)
 Patient of Dr Willy Harvest. Patient seen 08/04/23 for GERD by Reginal Capra, PA. She was given Omeprazole 40 mg BID and famotidine for break through symptoms. Dr Leonia Raman did an EGD on 08/27/23. She changed her to pantoprazole 40 mg BID. The patient complains of continued abdominal pain and says she cannot eat.   " Yes I am taking the pantoprazole as directed and I am still having issues. She told me I could drink 2 cups of coffee. I tried drinking dark coffee for a week and I hurt so I have put that away, but I still am having discomfort. I've lost weight and I feel like I can't eat anything. "  Please advise.

## 2023-09-16 ENCOUNTER — Telehealth: Payer: Self-pay | Admitting: Gastroenterology

## 2023-09-16 ENCOUNTER — Other Ambulatory Visit: Payer: Self-pay

## 2023-09-16 DIAGNOSIS — R0789 Other chest pain: Secondary | ICD-10-CM

## 2023-09-16 DIAGNOSIS — R1013 Epigastric pain: Secondary | ICD-10-CM

## 2023-09-16 MED ORDER — HYOSCYAMINE SULFATE 0.125 MG SL SUBL: 0.1250 mg | SUBLINGUAL_TABLET | SUBLINGUAL | 1 refills | Status: DC | PRN

## 2023-09-16 NOTE — Telephone Encounter (Signed)
 She has a tiny hiatal hernia so the lower esophageal sphincter can be weaker but should not have anything to do with symptoms.

## 2023-09-16 NOTE — Telephone Encounter (Signed)
 I spoke to her.  She is having lower sternal and epigastric pain that radiates into the back postprandial.  This has not been helped by the twice daily pantoprazole.  I reviewed her EGD and biopsy results which were not concerning, esophagitis seen at the endoscopy, biopsies did not show significant formation or intestinal metaplasia i.e. no Barrett's.   She is on a relatively bland diet and not eating much fat.  She is stressed out with her children and going from working 6 days a week to 2 days a week.  There has been some weight loss.  She is now having some constipation issues as well in the last few weeks.   The symptoms are very similar to what she had last fall and she had a negative ultrasound except for the question of steatosis, and a negative CT of the abdomen and pelvis overall.  The symptoms are most suggestive of biliary colic.   She needs to do the following so please call her and arrange these:  #1 I prescribed Levsin SL as needed to see if that will help.  Prescription sent already.  #2 I have changed my mind about the Carafate or sucralfate please cancel that prescription with her pharmacy  #3 she needs to have a CBC c-Met lipase and amylase  #4 please schedule a HIDA scan with ejection fraction regarding epigastric pain and chest pain  #5 would take MiraLAX daily for constipation at this point 1 dose

## 2023-09-16 NOTE — Telephone Encounter (Signed)
 Patient called and stated that she would like for either the nurse or Dr. Leonia Raman to go over her pathology report from her recent EGD that she had. Patient stated that she is still in a lot of pain and would like to know what her next's step need to be. Patient is requesting a call back.Please advise.

## 2023-09-16 NOTE — Telephone Encounter (Signed)
Information left on her voicemail. 

## 2023-09-16 NOTE — Telephone Encounter (Signed)
 Would you review her biopsy results for me please? The patient is very anxious. She is on pantoprazole and sucralfate.

## 2023-09-16 NOTE — Telephone Encounter (Signed)
 Pharmacy contacted and canceled the Carafate. Spoke with the patient about her imaging and her labs to be drawn.  Patient has one more question. She asks if she has a "weak sphincter" or if you can tell from her procedure report. She remembers something about that being mentioned after her procedure. Her husband remembers that too but can not remember what it was about.

## 2023-09-17 ENCOUNTER — Encounter: Payer: Self-pay | Admitting: Internal Medicine

## 2023-09-17 ENCOUNTER — Other Ambulatory Visit (INDEPENDENT_AMBULATORY_CARE_PROVIDER_SITE_OTHER)

## 2023-09-17 DIAGNOSIS — R1013 Epigastric pain: Secondary | ICD-10-CM | POA: Diagnosis not present

## 2023-09-17 DIAGNOSIS — R0789 Other chest pain: Secondary | ICD-10-CM

## 2023-09-17 LAB — CBC WITH DIFFERENTIAL/PLATELET
Basophils Absolute: 0.1 10*3/uL (ref 0.0–0.1)
Basophils Relative: 1.2 % (ref 0.0–3.0)
Eosinophils Absolute: 0.3 10*3/uL (ref 0.0–0.7)
Eosinophils Relative: 6 % — ABNORMAL HIGH (ref 0.0–5.0)
HCT: 37.8 % (ref 36.0–46.0)
Hemoglobin: 12.5 g/dL (ref 12.0–15.0)
Lymphocytes Relative: 29.9 % (ref 12.0–46.0)
Lymphs Abs: 1.3 10*3/uL (ref 0.7–4.0)
MCHC: 33.1 g/dL (ref 30.0–36.0)
MCV: 85.1 fl (ref 78.0–100.0)
Monocytes Absolute: 0.3 10*3/uL (ref 0.1–1.0)
Monocytes Relative: 5.7 % (ref 3.0–12.0)
Neutro Abs: 2.6 10*3/uL (ref 1.4–7.7)
Neutrophils Relative %: 57.2 % (ref 43.0–77.0)
Platelets: 246 10*3/uL (ref 150.0–400.0)
RBC: 4.44 Mil/uL (ref 3.87–5.11)
RDW: 13.4 % (ref 11.5–15.5)
WBC: 4.5 10*3/uL (ref 4.0–10.5)

## 2023-09-17 LAB — AMYLASE: Amylase: 42 U/L (ref 27–131)

## 2023-09-17 LAB — LIPASE: Lipase: 24 U/L (ref 11.0–59.0)

## 2023-09-28 ENCOUNTER — Encounter (HOSPITAL_COMMUNITY): Payer: Self-pay

## 2023-09-28 ENCOUNTER — Encounter: Payer: Self-pay | Admitting: Internal Medicine

## 2023-09-28 ENCOUNTER — Ambulatory Visit (HOSPITAL_COMMUNITY)
Admission: RE | Admit: 2023-09-28 | Discharge: 2023-09-28 | Disposition: A | Discharge status: Home / Self Care | Source: Ambulatory Visit | Attending: Internal Medicine | Admitting: Internal Medicine

## 2023-09-28 DIAGNOSIS — R0789 Other chest pain: Secondary | ICD-10-CM | POA: Diagnosis not present

## 2023-09-28 DIAGNOSIS — R1013 Epigastric pain: Secondary | ICD-10-CM | POA: Insufficient documentation

## 2023-09-28 MED ORDER — TECHNETIUM TC 99M MEBROFENIN IV KIT
5.4500 | PACK | Freq: Once | INTRAVENOUS | Status: AC
  Administered 2023-09-28: 5.45 via INTRAVENOUS

## 2023-10-05 ENCOUNTER — Ambulatory Visit (INDEPENDENT_AMBULATORY_CARE_PROVIDER_SITE_OTHER): Admitting: Physician Assistant

## 2023-10-05 ENCOUNTER — Encounter: Payer: Self-pay | Admitting: Physician Assistant

## 2023-10-05 VITALS — BP 122/64 | HR 100 | Ht 64.0 in | Wt 132.0 lb

## 2023-10-05 DIAGNOSIS — R1013 Epigastric pain: Secondary | ICD-10-CM

## 2023-10-05 DIAGNOSIS — K2100 Gastro-esophageal reflux disease with esophagitis, without bleeding: Secondary | ICD-10-CM

## 2023-10-05 NOTE — Progress Notes (Signed)
 Chief Complaint: Follow up epigastric pain  HPI:    Lindsey Clements is a 38 year old Caucasian female with a past medical history as listed below, known to Dr. Willy Harvest, who returns to clinic today for follow-up of her epigastric pain and multiple other GI questions.  03/19/2023 right upper quadrant ultrasound normal.  This was done for postprandial epigastric pain.    03/19/2023 CT scan with a tiny fat-containing umbilical hernia and changes consistent with hepatic steatosis reported as mild.    04/28/2023 alpha gal panel negative.    05/28/2023 patient saw Dr. Willy Harvest for a CT scan suggesting steatosis, ultrasound did not, patient having some epigastric pain previously but had resolved.  Recommended repeat LFTs in a year.    08/04/23 patient seen in clinic and discussed some epigastric/chest pain that radiated through to her back and left side of her chest.  At that time increase Omeprazole  to 40 mg once daily and prescribed Pepcid  20 mg as needed for breakthrough.  Explained if that did not help then would do an EGD.    08/27/2023 patient had EGD with Dr. Leonia Raman with LA grade B reflux esophagitis, esophageal mucosal changes suspicious for Barrett's and a 1 cm hiatal hernia.  Patient told to use Pantoprazole  40 mg p.o. twice daily for 3 months followed by 40 mg daily.  Biopsies did not show dysplasia or Barrett's esophagus.    09/17/2023 CBC and CMP normal.  Amylase and lipase normal.   4 /28/25 HIDA scan was normal.    Today, patient returns to clinic accompanied by her young son.  She continues with a small amount of epigastric discomfort, currently on her Pantoprazole  40 mg twice daily which she has been on for the past month.  Tells me things have gotten a lot better though and she has been able to add back in a few things in her diet, she had pizza the other day and it did not hurt, had a hot dog as well as onions and peppers yesterday and that did not bother her.  Still feels like if someone lays  their head on her stomach or if she touches in a certain area it is uncomfortable.  Discusses how her abdominal wall's muscle spread after having 2 children.    Also asked about food sensitivities given that when she eats eggs she feels like she has stomach discomfort.    Denies fever, chills or continued weight loss.  Past Medical History:  Diagnosis Date   Mild acne    Mononucleosis 05/2009   Rectocele affecting pregnancy    noted in prenatal but pt wants it on her EPIC chart that she has this problem   Scoliosis     Past Surgical History:  Procedure Laterality Date   MYRINGOTOMY     TONSILLECTOMY     WISDOM TOOTH EXTRACTION      Current Outpatient Medications  Medication Sig Dispense Refill   pantoprazole  (PROTONIX ) 40 MG tablet Take 1 tablet (40 mg total) by mouth 2 (two) times daily. 180 tablet 0   azelastine  (ASTELIN ) 0.1 % nasal spray Place 1 spray into both nostrils 2 (two) times daily. Use in each nostril as directed (Patient not taking: Reported on 10/05/2023) 30 mL 12   fexofenadine  (ALLEGRA  ALLERGY) 180 MG tablet Take 1 tablet (180 mg total) by mouth daily. (Patient not taking: Reported on 10/05/2023) 90 tablet 0   hyoscyamine  (LEVSIN/SL) 0.125 MG SL tablet Place 1 tablet (0.125 mg total) under the tongue every 4 (  four) hours as needed for cramping (abdominal pain). (Patient not taking: Reported on 10/05/2023) 60 tablet 1   No current facility-administered medications for this visit.    Allergies as of 10/05/2023 - Review Complete 10/05/2023  Allergen Reaction Noted   Cephalexin  Swelling 09/26/2013   Penicillins Swelling 08/30/2013    Family History  Problem Relation Age of Onset   Hypertension Mother    Thyroid  disease Mother        hyperthyroid   Alcohol abuse Maternal Uncle    Hyperlipidemia Maternal Grandmother    Diabetes Maternal Grandmother    Breast cancer Maternal Grandmother 59   Congestive Heart Failure Maternal Grandmother    Alcohol abuse Maternal  Grandfather    Hyperlipidemia Maternal Grandfather    Emphysema Paternal Grandmother    Alcohol abuse Paternal Grandmother    Alzheimer's disease Paternal Grandfather    Alcohol abuse Paternal Grandfather    Alcohol abuse Half-Brother    Miscarriages / Stillbirths Neg Hx    Colon cancer Neg Hx    Colon polyps Neg Hx    Esophageal cancer Neg Hx    Rectal cancer Neg Hx    Stomach cancer Neg Hx     Social History   Socioeconomic History   Marital status: Married    Spouse name: [name]   Number of children: 2   Years of education: Not on file   Highest education level: Not on file  Occupational History   Occupation: hairdresser   Occupation: [employer]  Tobacco Use   Smoking status: Never   Smokeless tobacco: Never  Vaping Use   Vaping status: Never Used  Substance and Sexual Activity   Alcohol use: No    Alcohol/week: 0.0 standard drinks of alcohol   Drug use: No   Sexual activity: Not Currently    Comment: spouse vasectomy  Other Topics Concern   Not on file  Social History Narrative   Exercises regularly. Eats whole foods.     No ETOH, Tobacco or drug use   Christian faith. Ok with blood products if needed.   Married to [place]. Has 2 children, a daughter [name] and a son [name]   Works as a Public affairs consultant in [place]   Social Drivers of Corporate investment banker Strain: Not on [employer] Insecurity: Not on file  Transportation Needs: Not on file  Physical Activity: Not on file  Stress: Not on file  Social Connections: Not on file  Intimate Partner Violence: Not on file    Review of Systems:    Constitutional: No weight loss, fever or chills Skin: No rash Cardiovascular: No chest pain  Respiratory: No SOB  Gastrointestinal: See HPI and otherwise negative Genitourinary: No dysuria  Neurological: No headache, dizziness or syncope Musculoskeletal: No new muscle or joint pain Hematologic: No bleeding  Psychiatric: No history of depression or  anxiety   Physical Exam:  Vital signs: BP 122/64   Pulse 100   Ht 5\' 4"  (1.626 m)   Wt 132 lb (59.9 kg)   BMI 22.66 kg/m    Constitutional:   Pleasant Caucasian female appears to be in NAD, Well developed, Well nourished, alert and cooperative Respiratory: Respirations even and unlabored. Lungs clear to auscultation bilaterally.   No wheezes, crackles, or rhonchi.  Cardiovascular: Normal S1, S2. No MRG. Regular rate and rhythm. No peripheral edema, cyanosis or pallor.  Gastrointestinal:  Soft, nondistended, mild epigastric ttp. No rebound or guarding. Normal bowel sounds. No appreciable  masses or hepatomegaly. Rectal:  Not performed.  Psychiatric: Demonstrates good judgement and reason without abnormal affect or behaviors.  RELEVANT LABS AND IMAGING: CBC    Component Value Date/Time   WBC 4.5 09/17/2023 0918   RBC 4.44 09/17/2023 0918   HGB 12.5 09/17/2023 0918   HCT 37.8 09/17/2023 0918   PLT 246.0 09/17/2023 0918   MCV 85.1 09/17/2023 0918   MCH 28.3 03/19/2023 0740   MCHC 33.1 09/17/2023 0918   RDW 13.4 09/17/2023 0918   LYMPHSABS 1.3 09/17/2023 0918   MONOABS 0.3 09/17/2023 0918   EOSABS 0.3 09/17/2023 0918   BASOSABS 0.1 09/17/2023 0918    CMP     Component Value Date/Time   NA 140 03/19/2023 0740   K 3.8 03/19/2023 0740   CL 104 03/19/2023 0740   CO2 30 03/19/2023 0740   GLUCOSE 87 03/19/2023 0740   BUN 18 03/19/2023 0740   CREATININE 0.81 03/19/2023 0740   CREATININE 0.78 11/21/2016 1424   CALCIUM 9.5 03/19/2023 0740   PROT 7.2 03/19/2023 0740   ALBUMIN 4.5 03/19/2023 0740   AST 14 (L) 03/19/2023 0740   ALT 8 03/19/2023 0740   ALKPHOS 55 03/19/2023 0740   BILITOT 0.6 03/19/2023 0740   GFRNONAA >60 03/19/2023 0740    Assessment: 1.  Epigastric pain: Some continuation of pain, may be related to spread of rectus abdominis after children and sensitivity in this area to touch +/- below 2.  GERD with esophagitis: Recent EGD with esophagitis, symptoms  mostly under control on Pantoprazole  40 twice daily  Plan: 1.  Answered all of the patient's questions. 2.  Continue Pantoprazole  40 mg p.o. twice daily x 3 to 4 months, then decrease to once daily dosing, she will then follow-up and we will discuss titrating off of this medicine.  Her end goal is to not be on medicine at all.  If this is not possible given her hiatal hernia then she would consider having surgery. 3.  Continue antireflux diet and lifestyle modifications 4.  Offered patient allergy referral which she declined 5.  Patient to follow-up in clinic in 3 to 4 months.  Lindsey Capra, PA-C [facility] 10/05/2023, 9:15 AM  Cc: Eliodoro Guerin, DO

## 2023-10-05 NOTE — Patient Instructions (Signed)
 Continue Pantoprazole  40 mg twice daily 30-60 minutes before breakfast and dinner for 3 months, then decrease to once daily.   _______________________________________________________  If your blood pressure at your visit was 140/90 or greater, please contact your primary care physician to follow up on this.  _______________________________________________________  If you are age 37 or older, your body mass index should be between 23-30. Your Body mass index is 22.66 kg/m. If this is out of the aforementioned range listed, please consider follow up with your Primary Care Provider.  If you are age 3 or younger, your body mass index should be between 19-25. Your Body mass index is 22.66 kg/m. If this is out of the aformentioned range listed, please consider follow up with your Primary Care Provider.   ________________________________________________________  The [facility] providers would like to encourage you to use MYCHART to communicate with providers for non-urgent requests or questions.  Due to long hold times on the telephone, sending your provider a message by [facility] may be a faster and more efficient way to get a response.  Please allow 48 business hours for a response.  Please remember that this is for non-urgent requests.  _______________________________________________________

## 2023-10-13 ENCOUNTER — Other Ambulatory Visit: Payer: Self-pay

## 2023-10-13 ENCOUNTER — Ambulatory Visit: Payer: Self-pay | Admitting: Gastroenterology

## 2023-10-13 DIAGNOSIS — M7918 Myalgia, other site: Secondary | ICD-10-CM

## 2023-10-13 NOTE — Progress Notes (Signed)
 Order placed for referral to sports medicine. Patient notified via My Chart.

## 2023-10-26 ENCOUNTER — Other Ambulatory Visit: Payer: Self-pay | Admitting: Family Medicine

## 2023-10-29 ENCOUNTER — Ambulatory Visit: Payer: Self-pay | Admitting: Family Medicine

## 2023-10-29 ENCOUNTER — Encounter: Payer: Self-pay | Admitting: Family Medicine

## 2023-10-29 ENCOUNTER — Ambulatory Visit (INDEPENDENT_AMBULATORY_CARE_PROVIDER_SITE_OTHER): Admitting: Family Medicine

## 2023-10-29 ENCOUNTER — Ambulatory Visit: Admitting: Family Medicine

## 2023-10-29 VITALS — BP 90/54 | HR 73 | Temp 97.9°F | Ht 64.0 in | Wt 131.2 lb

## 2023-10-29 DIAGNOSIS — N898 Other specified noninflammatory disorders of vagina: Secondary | ICD-10-CM

## 2023-10-29 DIAGNOSIS — R350 Frequency of micturition: Secondary | ICD-10-CM | POA: Diagnosis not present

## 2023-10-29 LAB — URINALYSIS, ROUTINE W REFLEX MICROSCOPIC
Bilirubin, UA: NEGATIVE
Glucose, UA: NEGATIVE
Ketones, UA: NEGATIVE
Nitrite, UA: NEGATIVE
Protein,UA: NEGATIVE
RBC, UA: NEGATIVE
Specific Gravity, UA: 1.01 (ref 1.005–1.030)
Urobilinogen, Ur: 0.2 mg/dL (ref 0.2–1.0)
pH, UA: 7 (ref 5.0–7.5)

## 2023-10-29 LAB — WET PREP FOR TRICH, YEAST, CLUE
Clue Cell Exam: NEGATIVE
Trichomonas Exam: NEGATIVE
Yeast Exam: POSITIVE — AB

## 2023-10-29 LAB — MICROSCOPIC EXAMINATION
Bacteria, UA: NONE SEEN
RBC, Urine: NONE SEEN /HPF (ref 0–2)
Renal Epithel, UA: NONE SEEN /HPF
Yeast, UA: NONE SEEN

## 2023-10-29 MED ORDER — FLUCONAZOLE 150 MG PO TABS
150.0000 mg | ORAL_TABLET | Freq: Once | ORAL | 0 refills | Status: AC
Start: 2023-10-29 — End: 2023-10-29

## 2023-10-29 NOTE — Progress Notes (Signed)
 Subjective:  Patient ID: Lindsey Clements, female    DOB: 1986/01/15, 38 y.o.   MRN: [id number]  Patient Care Team: Eliodoro Guerin, DO as PCP - General (Family Medicine) Tower, Manley Seeds, MD Dyanna Glasgow, DO as Consulting Physician (Obstetrics and Gynecology) Eduardo Grade, MD as Referring Physician (Dermatology)   Chief Complaint:  Vaginal Itching (Symptoms started this morning ) and Urinary Frequency   HPI: Lindsey Clements is a 38 y.o. female presenting on 10/29/2023 for Vaginal Itching (Symptoms started this morning ) and Urinary Frequency   Vaginal Itching Associated symptoms include frequency.  Urinary Frequency  Associated symptoms include frequency.   1. Vaginal itching/2. Urinary frequency States that symptoms started this morning. Reports that she has increased sexual activity and noticed more itching and more frequent urination. Has history of yeast infection and UTI. Denies odor. Reports discharge, white/yellow. Denies blood. Denies abdominal pain.     Relevant past medical, surgical, family, and social history reviewed and updated as indicated.  Allergies and medications reviewed and updated. Data reviewed: Chart in Epic.   Past Medical History:  Diagnosis Date   Mild acne    Mononucleosis 05/2009   Rectocele affecting pregnancy    noted in prenatal but pt wants it on her EPIC chart that she has this problem   Scoliosis     Past Surgical History:  Procedure Laterality Date   MYRINGOTOMY     TONSILLECTOMY     WISDOM TOOTH EXTRACTION      Social History   Socioeconomic History   Marital status: Married    Spouse name: [name]   Number of children: 2   Years of education: Not on file   Highest education level: Not on file  Occupational History   Occupation: hairdresser   Occupation: [employer]  Tobacco Use   Smoking status: Never   Smokeless tobacco: Never  Vaping Use   Vaping status: Never Used  Substance and Sexual Activity   Alcohol use:  No    Alcohol/week: 0.0 standard drinks of alcohol   Drug use: No   Sexual activity: Not Currently    Comment: spouse vasectomy  Other Topics Concern   Not on file  Social History Narrative   Exercises regularly. Eats whole foods.     No ETOH, Tobacco or drug use   Christian faith. Ok with blood products if needed.   Married to [name]. 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   azelastine  (ASTELIN ) 0.1 % nasal spray Place 1 spray into both nostrils 2 (two) times daily. Use in each nostril as directed (Patient not taking: Reported on 10/29/2023)   fexofenadine  (ALLEGRA  ALLERGY) 180 MG tablet Take 1 tablet (180 mg total) by mouth daily. (Patient not taking: Reported on 10/29/2023)   hyoscyamine  (LEVSIN/SL) 0.125 MG SL tablet Place 1 tablet (0.125 mg total) under the tongue every 4 (four) hours as needed for cramping (abdominal pain). (Patient not taking:  Reported on 10/29/2023)   No facility-administered encounter medications on file as of 10/29/2023.    Allergies  Allergen Reactions   Cephalexin  Swelling    Throat swelling   Penicillins Swelling    Swelling of eyes and rash when she took it with mono  Thinks she may have had throat swelling   Review of Systems  Genitourinary:  Positive for frequency.   Objective:  BP (!) 90/54   Pulse 73   Temp 97.9 F (36.6 C) (Temporal)   Ht 5\' 4"  (1.626 m)   Wt 131 lb 3.2 oz (59.5 kg)   SpO2 100%   BMI 22.52 kg/m    Wt Readings from Last 3 Encounters:   10/29/23 131 lb 3.2 oz (59.5 kg)  10/05/23 132 lb (59.9 kg)  08/27/23 134 lb 9.6 oz (61.1 kg)   Physical Exam Constitutional:      General: She is awake. She is not in acute distress.    Appearance: Normal appearance. She is well-developed and well-groomed. She is not ill-appearing, toxic-appearing or diaphoretic.  Cardiovascular:     Rate and Rhythm: Normal rate and regular rhythm.     Pulses: Normal pulses.     Heart sounds: Normal heart sounds. No murmur heard.    No gallop.  Pulmonary:     Effort: Pulmonary effort is normal. No respiratory distress.     Breath sounds: Normal breath sounds. No stridor. No wheezing, rhonchi or rales.  Musculoskeletal:     Cervical back: Full passive range of motion without pain and neck supple.  Skin:    General: Skin is warm.     Capillary Refill: Capillary refill takes less than 2 seconds.  Neurological:     General: No focal deficit present.     Mental Status: She is alert, oriented to person, place, and time and easily aroused. Mental status is at baseline.     GCS: GCS eye subscore is 4. GCS verbal subscore is 5. GCS motor subscore is 6.     Motor: No weakness.  Psychiatric:        Attention and Perception: Attention and perception normal.        Mood and Affect: Mood and affect normal.        Speech: Speech normal.        Behavior: Behavior normal. Behavior is cooperative.        Thought Content: Thought content normal. Thought content does not include homicidal or suicidal ideation. Thought content does not include homicidal or suicidal plan.        Cognition and Memory: Cognition and memory normal.        Judgment: Judgment normal.     Results for orders placed or performed in visit on 09/17/23  Amylase   Collection Time: 09/17/23  9:18 AM  Result Value Ref Range   Amylase 42 27 - 131 U/L  Lipase   Collection Time: 09/17/23  9:18 AM  Result Value Ref Range   Lipase 24.0 11.0 - 59.0 U/L  CBC with Differential/Platelet    Collection Time: 09/17/23  9:18 AM  Result Value Ref Range   WBC 4.5 4.0 - 10.5 K/uL   RBC 4.44 3.87 - 5.11 Mil/uL   Hemoglobin 12.5 12.0 - 15.0 g/dL   HCT 29.5 28.4 - 13.2 %   MCV 85.1 78.0 - 100.0 fl   MCHC 33.1 30.0 - 36.0 g/dL   RDW 44.0 10.2 - 72.5 %   Platelets 246.0 150.0 - 400.0 K/uL  Neutrophils Relative % 57.2 43.0 - 77.0 %   Lymphocytes Relative 29.9 12.0 - 46.0 %   Monocytes Relative 5.7 3.0 - 12.0 %   Eosinophils Relative 6.0 (H) 0.0 - 5.0 %   Basophils Relative 1.2 0.0 - 3.0 %   Neutro Abs 2.6 1.4 - 7.7 K/uL   Lymphs Abs 1.3 0.7 - 4.0 K/uL   Monocytes Absolute 0.3 0.1 - 1.0 K/uL   Eosinophils Absolute 0.3 0.0 - 0.7 K/uL   Basophils Absolute 0.1 0.0 - 0.1 K/uL       04/28/2023    9:14 AM  Depression screen PHQ 2/9  Decreased Interest 0  Down, Depressed, Hopeless 0  PHQ - 2 Score 0  Altered sleeping 0  Tired, decreased energy 0  Change in appetite 0  Feeling bad or failure about yourself  0  Trouble concentrating 0  Moving slowly or fidgety/restless 0  Suicidal thoughts 0  PHQ-9 Score 0  Difficult doing work/chores Not difficult at all       04/28/2023    9:14 AM  GAD 7 : Generalized Anxiety Score  Nervous, Anxious, on Edge 0  Control/stop worrying 0  Worry too much - different things 0  Trouble relaxing 0  Restless 0  Easily annoyed or irritable 0  Afraid - awful might happen 0  Total GAD 7 Score 0  Anxiety Difficulty Not difficult at all   Pertinent labs & imaging results that were available during my care of the patient were reviewed by me and considered in my medical decision making.  Assessment & Plan:  Lindsey Clements was seen today for vaginal itching and urinary frequency.  Diagnoses and all orders for this visit:  Vaginal itching Based on UA, will await culture results to treat for UTI.  Will send in medication as below for yeast infection. Discussed with patient to talk with PCP if she is having recurrent yeast infections.  -      Urinalysis, Routine w reflex microscopic -     WET PREP FOR TRICH, YEAST, CLUE -     fluconazole  (DIFLUCAN ) 150 MG tablet; Take 1 tablet (150 mg total) by mouth once for 1 dose. May take second dose 72 hours after first dose if symptoms remain  Urinary frequency As above  -     Urinalysis, Routine w reflex microscopic -     WET PREP FOR TRICH, YEAST, CLUE -     fluconazole  (DIFLUCAN ) 150 MG tablet; Take 1 tablet (150 mg total) by mouth once for 1 dose. May take second dose 72 hours after first dose if symptoms remain  Continue all other maintenance medications.  Follow up plan: Return if symptoms worsen or fail to improve.   Continue healthy lifestyle choices, including diet (rich in fruits, vegetables, and lean proteins, and low in salt and simple carbohydrates) and exercise (at least 30 minutes of moderate physical activity daily).  Written and verbal instructions provided   The above assessment and management plan was discussed with the patient. The patient verbalized understanding of and has agreed to the management plan. Patient is aware to call the clinic if they develop any new symptoms or if symptoms persist or worsen. Patient is aware when to return to the clinic for a follow-up visit. Patient educated on when it is appropriate to go to the emergency department.   Jacqualyn Mates, DNP-FNP [facility] [address] [place], [place] [address] [phone]

## 2023-11-13 NOTE — Progress Notes (Unsigned)
 [facility] [address] [place] [address] Phone: [phone] Subjective:   Lindsey Clements, am serving as a scribe for Dr. Ronnell Clements.  I'm seeing this patient by the request  of:  Clements, Lindsey M, DO  CC: Pain more anterior of the chest and gastroenterology  [id number]  Lindsey Clements is a 38 y.o. female coming in with complaint of pain. Patient states that she was recently diagnosed with acid reflux. In 2001 she had pain in sternum after having 1st child. After 2nd child in 2003 pain came back after second child. Last October she went into ED due to pain in sternum. Saw gastro and they believed that she had stomach ulcer. Also was diagnosed with umbilical hernia aorund the same time. Omeprozole was not helpful. Now taking pantroprozole. Did not help pain near bottom of her sternum. Pain worsens with eating. Did online food sensitivity test online and found that she is allergic to almost everything. Does not have pain with physical activity.     Reviewing patient's chart did not lumbar x-rays in 2009 that showed some mild levoscoliosis but otherwise fairly unremarkable. Past Medical History:  Diagnosis Date   Mild acne    Mononucleosis 05/2009   Rectocele affecting pregnancy    noted in prenatal but pt wants it on her EPIC chart that she has this problem   Scoliosis    Past Surgical History:  Procedure Laterality Date   MYRINGOTOMY     TONSILLECTOMY     WISDOM TOOTH EXTRACTION     Social History   Socioeconomic History   Marital status: Married    Spouse name: Lindsey Clements   Number of children: 2   Years of education: Not on file   Highest education level: Not on file  Occupational History   Occupation: hairdresser   Occupation: [employer]  Tobacco Use   Smoking status: Never   Smokeless tobacco: Never  Vaping Use   Vaping status: Never Used  Substance and Sexual Activity   Alcohol use: No    Alcohol/week: 0.0 standard  drinks of alcohol   Drug use: No   Sexual activity: Not Currently    Comment: spouse vasectomy  Other Topics Concern   Not on file  Social History Narrative   Exercises regularly. Eats whole foods.     No ETOH, Tobacco or drug use   Christian faith. Ok with blood products if needed.   Married to Lindsey Clements. 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[place]   Social Drivers of Corporate investment banker Strain: Not on [employer] Insecurity: Not on file  Transportation Needs: Not on file  Physical Activity: Not on file  Stress: Not on file  Social Connections: Not on file   Allergies  Allergen Reactions   Cephalexin  Swelling    Throat swelling   Penicillins Swelling    Swelling of eyes and rash when she took it with mono  Thinks she may have had throat swelling   Family History  Problem Relation Age of Onset   Hypertension Mother    Thyroid  disease Mother        hyperthyroid   Alcohol abuse Maternal Uncle    Hyperlipidemia Maternal Grandmother    Diabetes Maternal Grandmother    Breast cancer Maternal Grandmother 68   Congestive Heart Failure Maternal Grandmother    Alcohol abuse Maternal Grandfather  Hyperlipidemia Maternal Grandfather    Emphysema Paternal Grandmother    Alcohol abuse Paternal Grandmother    Alzheimer's disease Paternal Grandfather    Alcohol abuse Paternal Grandfather    Alcohol abuse Half-Brother    Miscarriages / Stillbirths Neg Hx    Colon cancer Neg Hx    Colon polyps Neg Hx    Esophageal cancer Neg Hx    Rectal cancer Neg Hx    Stomach cancer Neg Hx       Current Outpatient Medications (Respiratory):    azelastine  (ASTELIN ) 0.1 % nasal spray, Place 1 spray into both nostrils 2 (two) times daily. Use in each nostril as directed (Patient not taking: Reported on 10/29/2023)   fexofenadine  (ALLEGRA  ALLERGY) 180 MG tablet, Take 1 tablet (180 mg total) by mouth daily. (Patient not taking:  Reported on 10/29/2023)  Current Outpatient Medications (Analgesics):    colchicine 0.6 MG tablet, Take 1 tablet (0.6 mg total) by mouth 2 (two) times daily.   Current Outpatient Medications (Other):    pantoprazole  (PROTONIX ) 40 MG tablet, Take 1 tablet (40 mg total) by mouth 2 (two) times daily.   hyoscyamine  (LEVSIN /SL) 0.125 MG SL tablet, Place 1 tablet (0.125 mg total) under the tongue every 4 (four) hours as needed for cramping (abdominal pain). (Patient not taking: Reported on 10/29/2023)   Reviewed prior external information including notes and imaging from  primary care provider As well as notes that were available from care everywhere and other healthcare systems.  Past medical history, social, surgical and family history all reviewed in electronic medical record.  No pertanent information unless stated regarding to the chief complaint.   Review of Systems:  No headache, visual changes, nausea, vomiting, diarrhea, constipation, dizziness, abdominal pain, skin rash, fevers, chills, night sweats, weight loss, swollen lymph nodes, body aches, joint swelling, chest pain, shortness of breath, mood changes. POSITIVE muscle aches  Objective  Blood pressure 102/72, pulse 77, height 5' 4 (1.626 m), weight 135 lb (61.2 kg), SpO2 95%, not currently breastfeeding.   General: No apparent distress alert and oriented x3 mood and affect normal, dressed appropriately.  HEENT: Pupils equal, extraocular movements intact  Respiratory: Patient's speak in full sentences and does not appear short of breath  Cardiovascular: No lower extremity edema, non tender, no erythema  Patient's abdominal wall does not have any extremely tenderness on exam today.  Patient is more nontender over the inferior aspect of the sternum.  Does seem to be a little bit longer.  Scoliosis of the thoracic spine noted.  Limited muscular skeletal ultrasound was performed and interpreted by Lindsey Clements, M  Limited ultrasound  does show some mild thickening noted of the xiphoid process.  Mild hypoechoic changes of the sternal costal margin Impression: Questionable irregularity noted at the xiphoid process.    Impression and Recommendations:    The above documentation has been reviewed and is accurate and complete Selena Swaminathan M Brice Kossman, DO

## 2023-11-16 ENCOUNTER — Ambulatory Visit (INDEPENDENT_AMBULATORY_CARE_PROVIDER_SITE_OTHER)

## 2023-11-16 ENCOUNTER — Encounter: Payer: Self-pay | Admitting: Family Medicine

## 2023-11-16 ENCOUNTER — Ambulatory Visit: Payer: Self-pay | Admitting: Family Medicine

## 2023-11-16 ENCOUNTER — Ambulatory Visit (INDEPENDENT_AMBULATORY_CARE_PROVIDER_SITE_OTHER): Admitting: Family Medicine

## 2023-11-16 VITALS — BP 102/72 | HR 77 | Ht 64.0 in | Wt 135.0 lb

## 2023-11-16 DIAGNOSIS — R079 Chest pain, unspecified: Secondary | ICD-10-CM | POA: Diagnosis not present

## 2023-11-16 DIAGNOSIS — R0789 Other chest pain: Secondary | ICD-10-CM | POA: Diagnosis not present

## 2023-11-16 DIAGNOSIS — M2550 Pain in unspecified joint: Secondary | ICD-10-CM | POA: Diagnosis not present

## 2023-11-16 LAB — CBC WITH DIFFERENTIAL/PLATELET
Basophils Absolute: 0.1 10*3/uL (ref 0.0–0.1)
Basophils Relative: 1.1 % (ref 0.0–3.0)
Eosinophils Absolute: 0.1 10*3/uL (ref 0.0–0.7)
Eosinophils Relative: 2.1 % (ref 0.0–5.0)
HCT: 35.7 % — ABNORMAL LOW (ref 36.0–46.0)
Hemoglobin: 11.9 g/dL — ABNORMAL LOW (ref 12.0–15.0)
Lymphocytes Relative: 38.4 % (ref 12.0–46.0)
Lymphs Abs: 1.9 10*3/uL (ref 0.7–4.0)
MCHC: 33.4 g/dL (ref 30.0–36.0)
MCV: 81.4 fl (ref 78.0–100.0)
Monocytes Absolute: 0.2 10*3/uL (ref 0.1–1.0)
Monocytes Relative: 4.6 % (ref 3.0–12.0)
Neutro Abs: 2.7 10*3/uL (ref 1.4–7.7)
Neutrophils Relative %: 53.8 % (ref 43.0–77.0)
Platelets: 242 10*3/uL (ref 150.0–400.0)
RBC: 4.38 Mil/uL (ref 3.87–5.11)
RDW: 13.5 % (ref 11.5–15.5)
WBC: 4.9 10*3/uL (ref 4.0–10.5)

## 2023-11-16 LAB — VITAMIN B12: Vitamin B-12: 289 pg/mL (ref 211–911)

## 2023-11-16 LAB — COMPREHENSIVE METABOLIC PANEL WITH GFR
ALT: 14 U/L (ref 0–35)
AST: 18 U/L (ref 0–37)
Albumin: 4.5 g/dL (ref 3.5–5.2)
Alkaline Phosphatase: 38 U/L — ABNORMAL LOW (ref 39–117)
BUN: 11 mg/dL (ref 6–23)
CO2: 30 meq/L (ref 19–32)
Calcium: 9.4 mg/dL (ref 8.4–10.5)
Chloride: 105 meq/L (ref 96–112)
Creatinine, Ser: 0.7 mg/dL (ref 0.40–1.20)
GFR: 110.49 mL/min (ref >60.00)
Glucose, Bld: 88 mg/dL (ref 70–99)
Potassium: 3.8 meq/L (ref 3.5–5.1)
Sodium: 141 meq/L (ref 135–145)
Total Bilirubin: 0.4 mg/dL (ref 0.2–1.2)
Total Protein: 7.2 g/dL (ref 6.0–8.3)

## 2023-11-16 LAB — TESTOSTERONE: Testosterone: 19.2 ng/dL (ref 15.00–40.00)

## 2023-11-16 LAB — C-REACTIVE PROTEIN: CRP: <1 mg/dL (ref 0.5–20.0)

## 2023-11-16 LAB — IBC PANEL
Iron: 47 ug/dL (ref 42–145)
Saturation Ratios: 10.3 % — ABNORMAL LOW (ref 20.0–50.0)
TIBC: 456.4 ug/dL — ABNORMAL HIGH (ref 250.0–450.0)
Transferrin: 326 mg/dL (ref 212.0–360.0)

## 2023-11-16 LAB — TSH: TSH: 1.19 u[IU]/mL (ref 0.35–5.50)

## 2023-11-16 LAB — SEDIMENTATION RATE: Sed Rate: <1 mm/h (ref 0–20)

## 2023-11-16 LAB — VITAMIN D 25 HYDROXY (VIT D DEFICIENCY, FRACTURES): VITD: 21.7 ng/mL — ABNORMAL LOW (ref 30.00–100.00)

## 2023-11-16 LAB — URIC ACID: Uric Acid, Serum: 3.8 mg/dL (ref 2.4–7.0)

## 2023-11-16 LAB — FERRITIN: Ferritin: 7.1 ng/mL — ABNORMAL LOW (ref 10.0–291.0)

## 2023-11-16 MED ORDER — COLCHICINE 0.6 MG PO TABS: 0.6000 mg | ORAL_TABLET | Freq: Two times a day (BID) | ORAL | 0 refills | Status: DC

## 2023-11-16 NOTE — Assessment & Plan Note (Signed)
 If costochondritis.  No hernia appreciated.  Could still be referred pain from the gastroenterology.  Discussed with patient at great length about icing regimen of home exercises.Not midsternal and more xiphoid process.  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  Will get x-rays and overread pending.  Will try colchicine to see if patient responds and would if it is more of a costochondritis. Follow-up again in 6 to 8 weeks

## 2023-11-16 NOTE — Patient Instructions (Addendum)
 Labs  Colchicine 2x a day 5 days Do prescribed exercises at least 3x a week See you again in 2 months

## 2023-11-17 ENCOUNTER — Other Ambulatory Visit: Payer: Self-pay

## 2023-11-17 MED ORDER — VITAMIN D (ERGOCALCIFEROL) 1.25 MG (50000 UNIT) PO CAPS: 50000.0000 [IU] | ORAL_CAPSULE | ORAL | 0 refills | Status: DC

## 2023-11-18 LAB — PTH, INTACT AND CALCIUM
Calcium: 9.5 mg/dL (ref 8.6–10.2)
PTH: 12 pg/mL — ABNORMAL LOW (ref 16–77)

## 2023-11-19 LAB — RHEUMATOID FACTOR: Rheumatoid fact SerPl-aCnc: <10 [IU]/mL (ref <14)

## 2023-11-19 LAB — CALCIUM, IONIZED: Calcium, Ion: 5.2 mg/dL (ref 4.7–5.5)

## 2023-11-19 LAB — ANGIOTENSIN CONVERTING ENZYME: Angiotensin-Converting Enzyme: 28 U/L (ref 9–67)

## 2023-11-19 LAB — ANA: Anti Nuclear Antibody (ANA): NEGATIVE

## 2023-11-19 LAB — CYCLIC CITRUL PEPTIDE ANTIBODY, IGG: Cyclic Citrullin Peptide Ab: <16 U

## 2023-11-22 ENCOUNTER — Other Ambulatory Visit: Payer: Self-pay | Admitting: Gastroenterology

## 2023-11-26 DIAGNOSIS — N760 Acute vaginitis: Secondary | ICD-10-CM | POA: Diagnosis not present

## 2023-12-01 DIAGNOSIS — Z124 Encounter for screening for malignant neoplasm of cervix: Secondary | ICD-10-CM | POA: Diagnosis not present

## 2023-12-01 DIAGNOSIS — Z1151 Encounter for screening for human papillomavirus (HPV): Secondary | ICD-10-CM | POA: Diagnosis not present

## 2023-12-01 DIAGNOSIS — Z01419 Encounter for gynecological examination (general) (routine) without abnormal findings: Secondary | ICD-10-CM | POA: Diagnosis not present

## 2023-12-01 DIAGNOSIS — Z6822 Body mass index (BMI) 22.0-22.9, adult: Secondary | ICD-10-CM | POA: Diagnosis not present

## 2023-12-03 ENCOUNTER — Other Ambulatory Visit: Payer: Self-pay | Admitting: Obstetrics & Gynecology

## 2023-12-03 DIAGNOSIS — N644 Mastodynia: Secondary | ICD-10-CM

## 2023-12-16 ENCOUNTER — Ambulatory Visit (INDEPENDENT_AMBULATORY_CARE_PROVIDER_SITE_OTHER): Admitting: Physician Assistant

## 2023-12-16 ENCOUNTER — Ambulatory Visit: Admitting: Gastroenterology

## 2023-12-16 ENCOUNTER — Encounter: Payer: Self-pay | Admitting: Physician Assistant

## 2023-12-16 VITALS — BP 112/68 | HR 95 | Ht 64.0 in | Wt 131.0 lb

## 2023-12-16 DIAGNOSIS — K2100 Gastro-esophageal reflux disease with esophagitis, without bleeding: Secondary | ICD-10-CM | POA: Diagnosis not present

## 2023-12-16 DIAGNOSIS — D509 Iron deficiency anemia, unspecified: Secondary | ICD-10-CM | POA: Diagnosis not present

## 2023-12-16 DIAGNOSIS — R1013 Epigastric pain: Secondary | ICD-10-CM

## 2023-12-16 MED ORDER — PANTOPRAZOLE SODIUM 40 MG PO TBEC: 40.0000 mg | DELAYED_RELEASE_TABLET | Freq: Two times a day (BID) | ORAL | 1 refills | Status: DC

## 2023-12-16 NOTE — Progress Notes (Signed)
 Chief Complaint: Follow-up GERD and dysphagia  HPI:    Lindsey Clements is a 38 year old Caucasian female with a past medical history as listed below, known to Dr. Avram, who returns to clinic today for follow-up of GERD and dysphagia.    03/19/2023 right upper quadrant ultrasound normal.  This was done for postprandial epigastric pain.    03/19/2023 CT scan with a tiny fat-containing umbilical hernia and changes consistent with hepatic steatosis reported as mild.    04/28/2023 alpha gal panel negative.    05/28/2023 patient saw Dr. Avram for a CT scan suggesting steatosis, ultrasound did not, patient having some epigastric pain previously but had resolved.  Recommended repeat LFTs in a year.    08/04/23 patient seen in clinic and discussed some epigastric/chest pain that radiated through to her back and left side of her chest.  At that time increase Omeprazole  to 40 mg once daily and prescribed Pepcid  20 mg as needed for breakthrough.  Explained if that did not help then would do an EGD.    08/27/2023 patient had EGD with Dr. Shila with LA grade B reflux esophagitis, esophageal mucosal changes suspicious for Barrett's and a 1 cm hiatal hernia.  Patient told to use Pantoprazole  40 mg p.o. twice daily for 3 months followed by 40 mg daily.  Biopsies did not show dysplasia or Barrett's esophagus.    09/17/2023 CBC and CMP normal.  Amylase and lipase normal.   4 /28/25 HIDA scan was normal.    10/05/2023 patient seen in clinic and still continues with a small amount of epigastric discomfort on Pantoprazole  40 mg twice a day.  At that time continue Pantoprazole  40 mg p.o. twice daily for the next 3 to 4 months.  Then discussed decreasing to once daily.  Patient expressed that she did not want to be on medication at all and would even consider surgery if she found herself needing it.  Was referred to DO Zach Smith for further eval.    11/16/2023 patient seen by Darlyn Sharps, DO.  At that time had some mild  thickening noted in the xiphoid process on ultrasound and mild hypoechoic changes to the sternal costal margin.  It was thought that if this was costochondritis and Colchicine  may help.  Was recommended she get x-rays.    11/16/2023 iron panel with a percent saturation low 10.3.  Iron level normal at 47.  Ferritin low at 7.1.  Patient started on iron 65 mg with 500 mg of vitamin C daily.  Also noted to be vitamin D  deficient.    Today, patient presents to clinic and tells me that about a month ago she decreased her Pantoprazole  to 40 mg once daily and initially did okay but within the past week now she is feeling like it is kind of hard to swallow and that something is there.  Relays her recent workup with Zach Smith and some iron deficiency.  Denies any heavy periods.    Denies fever, chills or weight loss.   Past Medical History:  Diagnosis Date   Mild acne    Mononucleosis 05/2009   Rectocele affecting pregnancy    noted in prenatal but pt wants it on her EPIC chart that she has this problem   Scoliosis     Past Surgical History:  Procedure Laterality Date   MYRINGOTOMY     TONSILLECTOMY     WISDOM TOOTH EXTRACTION      Current Outpatient Medications  Medication Sig Dispense Refill  ferrous sulfate 325 (65 FE) MG EC tablet Take 325 mg by mouth daily with breakfast.     Multiple Vitamin (MULTIVITAMIN ADULT PO) Take by mouth.     Vitamin D , Ergocalciferol , (DRISDOL ) 1.25 MG (50000 UNIT) CAPS capsule Take 1 capsule (50,000 Units total) by mouth every 7 (seven) days. 12 capsule 0   pantoprazole  (PROTONIX ) 40 MG tablet Take 1 tablet (40 mg total) by mouth 2 (two) times daily. TAKE 1 TABLET(40 MG) BY MOUTH TWICE DAILY 180 tablet 1   No current facility-administered medications for this visit.    Allergies as of 12/16/2023 - Review Complete 12/16/2023  Allergen Reaction Noted   Cephalexin  Swelling 09/26/2013   Penicillins Swelling 08/30/2013    Family History  Problem Relation  Age of Onset   Hypertension Mother    Thyroid  disease Mother        hyperthyroid   Alcohol abuse Maternal Uncle    Hyperlipidemia Maternal Grandmother    Diabetes Maternal Grandmother    Breast cancer Maternal Grandmother 70   Congestive Heart Failure Maternal Grandmother    Alcohol abuse Maternal Grandfather    Hyperlipidemia Maternal Grandfather    Emphysema Paternal Grandmother    Alcohol abuse Paternal Grandmother    Alzheimer's disease Paternal Grandfather    Alcohol abuse Paternal Grandfather    Alcohol abuse Half-Brother    Miscarriages / Stillbirths Neg Hx    Colon cancer Neg Hx    Colon polyps Neg Hx    Esophageal cancer Neg Hx    Rectal cancer Neg Hx    Stomach cancer Neg Hx     Social History   Socioeconomic History   Marital status: Married    Spouse name: [name]   Number of children: 2   Years of education: Not on file   Highest education level: Not on file  Occupational History   Occupation: hairdresser   Occupation: [employer]  Tobacco Use   Smoking status: Never   Smokeless tobacco: Never  Vaping Use   Vaping status: Never Used  Substance and Sexual Activity   Alcohol use: No    Alcohol/week: 0.0 standard drinks of alcohol   Drug use: No   Sexual activity: Not Currently    Comment: spouse vasectomy  Other Topics Concern   Not on file  Social History Narrative   Exercises regularly. Eats whole foods.     No ETOH, Tobacco or drug use   Christian faith. Ok with blood products if needed.   Married to Grandin. 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[place]   Social Drivers of Corporate investment banker Strain: Not on [employer] Insecurity: Not on file  Transportation Needs: Not on file  Physical Activity: Not on file  Stress: Not on file  Social Connections: Not on file  Intimate Partner Violence: Not on file    Review of Systems:    Constitutional: No weight loss, fever or  chills Cardiovascular: No chest pain Respiratory: No SOB  Gastrointestinal: See HPI and otherwise negative   Physical Exam:  Vital signs: BP 112/68   Pulse 95   Ht 5' 4 (1.626 m)   Wt 131 lb (59.4 kg)   LMP 11/07/2023   BMI 22.49 kg/m   Constitutional:   Pleasant Caucasian female appears to be in NAD, Well developed, Well nourished, alert and cooperative  Respiratory: Respirations even and unlabored. Lungs clear to auscultation  bilaterally.   No wheezes, crackles, or rhonchi.  Cardiovascular: Normal S1, S2. No MRG. Regular rate and rhythm. No peripheral edema, cyanosis or pallor.  Gastrointestinal:  Soft, nondistended, nontender. No rebound or guarding. Normal bowel sounds. No appreciable masses or hepatomegaly. Rectal:  Not performed.  Psychiatric: Demonstrates good judgement and reason without abnormal affect or behaviors.  RELEVANT LABS AND IMAGING: CBC    Component Value Date/Time   WBC 4.9 11/16/2023 1409   RBC 4.38 11/16/2023 1409   HGB 11.9 (L) 11/16/2023 1409   HCT 35.7 (L) 11/16/2023 1409   PLT 242.0 11/16/2023 1409   MCV 81.4 11/16/2023 1409   MCH 28.3 03/19/2023 0740   MCHC 33.4 11/16/2023 1409   RDW 13.5 11/16/2023 1409   LYMPHSABS 1.9 11/16/2023 1409   MONOABS 0.2 11/16/2023 1409   EOSABS 0.1 11/16/2023 1409   BASOSABS 0.1 11/16/2023 1409    CMP     Component Value Date/Time   NA 141 11/16/2023 1409   K 3.8 11/16/2023 1409   CL 105 11/16/2023 1409   CO2 30 11/16/2023 1409   GLUCOSE 88 11/16/2023 1409   BUN 11 11/16/2023 1409   CREATININE 0.70 11/16/2023 1409   CREATININE 0.78 11/21/2016 1424   CALCIUM 9.5 11/16/2023 1409   CALCIUM 9.4 11/16/2023 1409   PROT 7.2 11/16/2023 1409   ALBUMIN 4.5 11/16/2023 1409   AST 18 11/16/2023 1409   ALT 14 11/16/2023 1409   ALKPHOS 38 (L) 11/16/2023 1409   BILITOT 0.4 11/16/2023 1409   GFRNONAA >60 03/19/2023 0740    Assessment: 1.  Dysphagia and GERD: Was previously controlled on Pantoprazole  40 mg twice  a day, when she decreased to once daily dosing she has now had a feeling of globus/dysphagia over the past week, though she is now eating what she wants except for coffee; likely known esophagitis plus GERD 2.  Iron deficiency anemia: New diagnosis with Zach Smith within the past month, she has been placed on oral iron supplementation, previous EGD in March, will need to consider colonoscopy to consider GI source in the future  Plan: 1.  For now patient is going to increase her Pantoprazole  to 40 mg twice a day again.  Refill #60 with 5 refills.  Discussed that I would hope this would help her swallowing symptoms over the next couple of weeks, if she does not notice a difference I recommend a barium swallow for further eval. 2.  We briefly discussed iron deficiency anemia which was discovered by Darlyn Sharps recently, she is now on an oral iron supplement and has follow-up with him in August.  She may require a colonoscopy in the future for further evaluation if other etiology is not discerned. 3.  Patient asked about surgery for her reflux but she does not want to stay on medicine forever.  Could consider referral to Dr. San for further evaluation of TIF if she is still requiring twice daily PPI follow-up. 4.  Unfortunately patient has been through a lot lately with the symptoms, back and forth from the doctor quite frequently.  Will wait on colonoscopy at this point. 5.  Patient to follow-up in 2 to 3 months.  At that time we will evaluate iron deficiency anemia and will discuss colonoscopy for further evaluation.    Delon Failing, PA-C [facility] 12/16/2023, 4:39 PM  Cc: Jolinda Norene HERO, DO   Primary gastroenterologist:  Re: Iron deficiency anemia and potential colonoscopy, given that she is a 37 year old menstruating female, menstrual blood  loss is most likely cause of anemia.  Need to explore for lower gastrointestinal symptoms or signs like bleeding change in bowel  habits.  Note that she was anemic throughout her 2 pregnancies, question if she adequately repleted iron then.  Regarding GERD symptoms, TIF is a potential option though I have some concerned about functional overlay.    Depending upon clinical course could need repeat EGD by me or Dr. San if referred about potential TIF, to see if esophagitis has resolved.  That is to say if she continues to have symptoms in the face of PPI repeating EGD on twice daily PP should be considered.  Lupita CHARLENA Commander, MD, NOLIA

## 2023-12-16 NOTE — Patient Instructions (Signed)
 We have sent the following medications to your pharmacy for you to pick up at your convenience: Pantoprazole  40 mg twice daily 30-60 minutes before breakfast and dinner.  Call or send a Mychart message in 1-2 weeks with an update.   _______________________________________________________  If your blood pressure at your visit was 140/90 or greater, please contact your primary care physician to follow up on this.  _______________________________________________________  If you are age 37 or older, your body mass index should be between 23-30. Your Body mass index is 22.49 kg/m. If this is out of the aforementioned range listed, please consider follow up with your Primary Care Provider.  If you are age 73 or younger, your body mass index should be between 19-25. Your Body mass index is 22.49 kg/m. If this is out of the aformentioned range listed, please consider follow up with your Primary Care Provider.   ________________________________________________________  The [facility] providers would like to encourage you to use MYCHART to communicate with providers for non-urgent requests or questions.  Due to long hold times on the telephone, sending your provider a message by [facility] may be a faster and more efficient way to get a response.  Please allow 48 business hours for a response.  Please remember that this is for non-urgent requests.  _______________________________________________________

## 2023-12-18 ENCOUNTER — Encounter: Payer: Self-pay | Admitting: Physician Assistant

## 2024-01-12 NOTE — Progress Notes (Deleted)
 [facility] [address] [place] [address] Phone: [phone] Subjective:    I'm seeing this patient by the request  of:  Jolinda Norene HERO, DO  CC:   YEP:[id number]  11/16/2023 If costochondritis.  No hernia appreciated.  Could still be referred pain from the gastroenterology.  Discussed with patient at great length about icing regimen of home exercises.Not midsternal and more xiphoid process.  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  Will get x-rays and overread pending.  Will try colchicine  to see if patient responds and would if it is more of a costochondritis. Follow-up again in 6 to 8 weeks     Updated 01/19/2024 Lindsey Clements is a 38 y.o. female coming in with complaint of polyarthralgia, sternal pain, recently seen by gastroenterology for gastroesophageal reflux disease.  Also discussed follow-up for her iron deficiency.  Patient states   Will    Past Medical History:  Diagnosis Date   Iron deficiency anemia    Mild acne    Mononucleosis 05/2009   Rectocele affecting pregnancy    noted in prenatal but pt wants it on her EPIC chart that she has this problem   Reflux esophagitis    Scoliosis    Past Surgical History:  Procedure Laterality Date   ESOPHAGOGASTRODUODENOSCOPY  08/2023   MYRINGOTOMY     TONSILLECTOMY     WISDOM TOOTH EXTRACTION     Social History   Socioeconomic History   Marital status: Married    Spouse name: [name]   Number of children: 2   Years of education: Not on file   Highest education level: Not on file  Occupational History   Occupation: hairdresser   Occupation: [employer]  Tobacco Use   Smoking status: Never   Smokeless tobacco: Never  Vaping Use   Vaping status: Never Used  Substance and Sexual Activity   Alcohol use: No    Alcohol/week: 0.0 standard drinks of alcohol   Drug use: No   Sexual activity: Not  Currently    Comment: spouse vasectomy  Other Topics Concern   Not on file  Social History Narrative   Exercises regularly. Eats whole foods.     No ETOH, Tobacco or drug use   Christian faith. Ok with blood products if needed.   Married to Spry. 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[place]   Social Drivers of Corporate investment banker Strain: Not on [employer] Insecurity: Not on file  Transportation Needs: Not on file  Physical Activity: Not on file  Stress: Not on file  Social Connections: Not on file   Allergies  Allergen Reactions   Cephalexin  Swelling    Throat swelling   Penicillins Swelling    Swelling of eyes and rash when she took it with mono  Thinks she may have had throat swelling   Family History  Problem Relation Age of Onset   Hypertension Mother    Thyroid  disease Mother        hyperthyroid   Alcohol abuse Maternal Uncle    Hyperlipidemia Maternal Grandmother    Diabetes Maternal Grandmother    Breast cancer Maternal Grandmother 68   Congestive Heart Failure Maternal Grandmother    Alcohol abuse Maternal Grandfather    Hyperlipidemia Maternal Grandfather    Emphysema Paternal Grandmother    Alcohol abuse Paternal  Grandmother    Alzheimer's disease Paternal Grandfather    Alcohol abuse Paternal Grandfather    Alcohol abuse Half-Brother    Miscarriages / Stillbirths Neg Hx    Colon cancer Neg Hx    Colon polyps Neg Hx    Esophageal cancer Neg Hx    Rectal cancer Neg Hx    Stomach cancer Neg Hx         Current Outpatient Medications (Hematological):    ferrous sulfate 325 (65 FE) MG EC tablet, Take 325 mg by mouth daily with breakfast.  Current Outpatient Medications (Other):    Multiple Vitamin (MULTIVITAMIN ADULT PO), Take by mouth.   pantoprazole  (PROTONIX ) 40 MG tablet, Take 1 tablet (40 mg total) by mouth 2 (two) times daily. TAKE 1 TABLET(40 MG) BY MOUTH TWICE DAILY   Vitamin  D, Ergocalciferol , (DRISDOL ) 1.25 MG (50000 UNIT) CAPS capsule, Take 1 capsule (50,000 Units total) by mouth every 7 (seven) days.   Reviewed prior external information including notes and imaging from  primary care provider As well as notes that were available from care everywhere and other healthcare systems.  Past medical history, social, surgical and family history all reviewed in electronic medical record.  No pertanent information unless stated regarding to the chief complaint.   Review of Systems:  No headache, visual changes, nausea, vomiting, diarrhea, constipation, dizziness, abdominal pain, skin rash, fevers, chills, night sweats, weight loss, swollen lymph nodes, body aches, joint swelling, chest pain, shortness of breath, mood changes. POSITIVE muscle aches  Objective  There were no vitals taken for this visit.   General: No apparent distress alert and oriented x3 mood and affect normal, dressed appropriately.  HEENT: Pupils equal, extraocular movements intact  Respiratory: Patient's speak in full sentences and does not appear short of breath  Cardiovascular: No lower extremity edema, non tender, no erythema      Impression and Recommendations:    The above documentation has been reviewed and is accurate and complete Zissel Biederman M Josephina Melcher, DO

## 2024-01-19 ENCOUNTER — Ambulatory Visit: Admitting: Family Medicine

## 2024-02-04 ENCOUNTER — Encounter: Payer: Self-pay | Admitting: Internal Medicine

## 2024-02-04 NOTE — Telephone Encounter (Signed)
 Delon the pt only has heartburn no trouble with swallowing

## 2024-02-04 NOTE — Telephone Encounter (Signed)
 Delon see note from your last office visit in [place] below.  Do you want to order barium swallow?  Plan: 1.  For now patient is going to increase her Pantoprazole  to 40 mg twice a day again.  Refill #60 with 5 refills.  Discussed that I would hope this would help her swallowing symptoms over the next couple of weeks, if she does not notice a difference I recommend a barium swallow for further eval.

## 2024-02-15 DIAGNOSIS — M9903 Segmental and somatic dysfunction of lumbar region: Secondary | ICD-10-CM | POA: Diagnosis not present

## 2024-02-15 DIAGNOSIS — M9902 Segmental and somatic dysfunction of thoracic region: Secondary | ICD-10-CM | POA: Diagnosis not present

## 2024-02-15 DIAGNOSIS — R519 Headache, unspecified: Secondary | ICD-10-CM | POA: Diagnosis not present

## 2024-02-15 DIAGNOSIS — M9901 Segmental and somatic dysfunction of cervical region: Secondary | ICD-10-CM | POA: Diagnosis not present

## 2024-02-15 DIAGNOSIS — M9905 Segmental and somatic dysfunction of pelvic region: Secondary | ICD-10-CM | POA: Diagnosis not present

## 2024-02-22 DIAGNOSIS — M9902 Segmental and somatic dysfunction of thoracic region: Secondary | ICD-10-CM | POA: Diagnosis not present

## 2024-02-22 DIAGNOSIS — M9901 Segmental and somatic dysfunction of cervical region: Secondary | ICD-10-CM | POA: Diagnosis not present

## 2024-02-22 DIAGNOSIS — R519 Headache, unspecified: Secondary | ICD-10-CM | POA: Diagnosis not present

## 2024-02-22 DIAGNOSIS — M9903 Segmental and somatic dysfunction of lumbar region: Secondary | ICD-10-CM | POA: Diagnosis not present

## 2024-02-22 NOTE — Progress Notes (Unsigned)
 [facility] [address] [place] [address] Phone: [phone] Subjective:   Lindsey Clements, am serving as a scribe for Dr. Arthea Clements.  I'm seeing this patient by the request  of:  Lindsey Norene HERO, DO  CC: Sternal pain follow-up  YEP:Dlagzrupcz  11/16/2023 If costochondritis.  No hernia appreciated.  Could still be referred pain from the gastroenterology.  Discussed with patient at great length about icing regimen of home exercises.Not midsternal and more xiphoid process.  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  Will get x-rays and overread pending.  Will try colchicine  to see if patient responds and would if it is more of a costochondritis. Follow-up again in 6 to 8 weeks     Updated 02/23/2024 Lindsey Clements is a 38 y.o. female coming in with complaint of sternum pain. Patient states that she is doing ok. About the same as last visit. Feels pain coming from hernia. Having acid reflux due to coffee and pain increased.   Taking Vit D and iron. Had labwork last visit.  Lab work did show low vitamin D , low iron, low anemia noted patient also had low B12.  Still having reflux.       Past Medical History:  Diagnosis Date   Iron deficiency anemia    Mild acne    Mononucleosis 05/2009   Rectocele affecting pregnancy    noted in prenatal but pt wants it on her EPIC chart that she has this problem   Reflux esophagitis    Scoliosis    Past Surgical History:  Procedure Laterality Date   ESOPHAGOGASTRODUODENOSCOPY  08/2023   MYRINGOTOMY     TONSILLECTOMY     WISDOM TOOTH EXTRACTION     Social History   Socioeconomic History   Marital status: Married    Spouse name: Lindsey Clements   Number of children: 2   Years of education: Not on file   Highest education level: Not on file  Occupational History   Occupation: hairdresser   Occupation: [employer]  Tobacco Use    Smoking status: Never   Smokeless tobacco: Never  Vaping Use   Vaping status: Never Used  Substance and Sexual Activity   Alcohol use: No    Alcohol/week: 0.0 standard drinks of alcohol   Drug use: No   Sexual activity: Not Currently    Comment: spouse vasectomy  Other Topics Concern   Not on file  Social History Narrative   Exercises regularly. Eats whole foods.     No ETOH, Tobacco or drug use   Christian faith. Ok with blood products if needed.   Married to [place]. 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[place]   Social Drivers of Corporate investment banker Strain: Not on [employer] Insecurity: Not on file  Transportation Needs: Not on file  Physical Activity: Not on file  Stress: Not on file  Social Connections: Not on file   Allergies  Allergen Reactions   Cephalexin  Swelling    Throat swelling   Penicillins Swelling    Swelling of eyes and rash when she took it with mono  Thinks she may have had throat swelling   Family History  Problem Relation Age of Onset   Hypertension Mother    Thyroid  disease Mother        hyperthyroid   Alcohol abuse  Maternal Uncle    Hyperlipidemia Maternal Grandmother    Diabetes Maternal Grandmother    Breast cancer Maternal Grandmother 68   Congestive Heart Failure Maternal Grandmother    Alcohol abuse Maternal Grandfather    Hyperlipidemia Maternal Grandfather    Emphysema Paternal Grandmother    Alcohol abuse Paternal Grandmother    Alzheimer's disease Paternal Grandfather    Alcohol abuse Paternal Grandfather    Alcohol abuse Half-Brother    Miscarriages / Stillbirths Neg Hx    Colon cancer Neg Hx    Colon polyps Neg Hx    Esophageal cancer Neg Hx    Rectal cancer Neg Hx    Stomach cancer Neg Hx         Current Outpatient Medications (Hematological):    ferrous sulfate 325 (65 FE) MG EC tablet, Take 325 mg by mouth daily with breakfast.  Current Outpatient  Medications (Other):    Multiple Vitamin (MULTIVITAMIN ADULT PO), Take by mouth.   pantoprazole  (PROTONIX ) 40 MG tablet, Take 1 tablet (40 mg total) by mouth 2 (two) times daily. TAKE 1 TABLET(40 MG) BY MOUTH TWICE DAILY   Vitamin D , Ergocalciferol , (DRISDOL ) 1.25 MG (50000 UNIT) CAPS capsule, Take 1 capsule (50,000 Units total) by mouth every 7 (seven) days.   Reviewed prior external information including notes and imaging from  primary care provider As well as notes that were available from care everywhere and other healthcare systems.  Past medical history, social, surgical and family history all reviewed in electronic medical record.  No pertanent information unless stated regarding to the chief complaint.   Review of Systems:  No headache, visual changes, nausea, vomiting, diarrhea, constipation, dizziness skin rash, fevers, chills, night sweats, weight loss, swollen lymph nodes, body aches, joint swelling, chest pain, shortness of breath, mood changes. POSITIVE muscle aches, abdominal pain  Objective  Blood pressure 96/68, pulse 81, height 5' 4 (1.626 m), weight 131 lb (59.4 kg), SpO2 98%.   General: No apparent distress alert and oriented x3 mood and affect normal, dressed appropriately.  Seems a little more anxious. HEENT: Pupils equal, extraocular movements intact  Respiratory: Patient's speak in full sentences and does not appear short of breath  Cardiovascular: No lower extremity edema, non tender, no erythema  .  Patient still minorly tender in the inferior aspect of the sternum.  Patient has mild pain over the liver noted as well.  No pain over the umbilical area.    Impression and Recommendations:    The above documentation has been reviewed and is accurate and complete Lindsey Tomaszewski M [name], DO

## 2024-02-23 ENCOUNTER — Ambulatory Visit (INDEPENDENT_AMBULATORY_CARE_PROVIDER_SITE_OTHER): Admitting: Family Medicine

## 2024-02-23 ENCOUNTER — Encounter: Payer: Self-pay | Admitting: Family Medicine

## 2024-02-23 VITALS — BP 96/68 | HR 81 | Ht 64.0 in | Wt 131.0 lb

## 2024-02-23 DIAGNOSIS — R0789 Other chest pain: Secondary | ICD-10-CM | POA: Diagnosis not present

## 2024-02-23 DIAGNOSIS — R5383 Other fatigue: Secondary | ICD-10-CM

## 2024-02-23 NOTE — Patient Instructions (Addendum)
 Take supplements  Future labs Watch heart burn  Food journal I will try to look up [facility] See me in 2-3 months

## 2024-02-23 NOTE — Assessment & Plan Note (Signed)
 Still feel most likely is secondary to heartburn and gastroesophageal reflux disease.  They did discuss the possibility of repeating another EGD.  Patient wants to hold on that and will try to do less caffeine which seems to be one of her main triggers.  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  I did not save the pictures or on the ultrasound today but were not still there.  Patient has been a little compliant on some of the supplementation we will try to do that on a more regular basis and then recheck labs again in 6 weeks.  Depending on how patient responds then we will talk about such things as transfusions.  Follow-up with me again in 2 months.

## 2024-02-24 ENCOUNTER — Encounter: Payer: Self-pay | Admitting: Internal Medicine

## 2024-03-09 ENCOUNTER — Ambulatory Visit

## 2024-03-09 ENCOUNTER — Telehealth: Payer: Self-pay

## 2024-03-09 VITALS — Ht 64.0 in | Wt 131.0 lb

## 2024-03-09 DIAGNOSIS — K219 Gastro-esophageal reflux disease without esophagitis: Secondary | ICD-10-CM

## 2024-03-09 DIAGNOSIS — D509 Iron deficiency anemia, unspecified: Secondary | ICD-10-CM

## 2024-03-09 DIAGNOSIS — R1013 Epigastric pain: Secondary | ICD-10-CM

## 2024-03-09 DIAGNOSIS — K2100 Gastro-esophageal reflux disease with esophagitis, without bleeding: Secondary | ICD-10-CM

## 2024-03-09 NOTE — Telephone Encounter (Signed)
 If she is feeling significantly better she can cancel the EGD.  I would like her to schedule a follow-up visit with me next available.

## 2024-03-09 NOTE — Telephone Encounter (Signed)
 Procedure has been cancelled. VM left making the patient aware. F/U visit scheduled for Friday 12/12 @ 9:10 AM. Pt advised to call back and r/s if this apt time does not work for her.

## 2024-03-09 NOTE — Telephone Encounter (Signed)
 Dr. Avram,   [facility] wanted me to make you aware that she think she may have figured out her triggers being soft drink and caffeine. Pt had reintroduced these products but has recently cut them back out and reports she is doing much better with minimal issues unless she eats something that causes a slight flare. Pt is wanting to know if she needs to proceed with her EGD or not.   Please advise, Thank you.

## 2024-03-09 NOTE — Progress Notes (Signed)
 No egg or soy allergy known to patient  No issues known to pt with past sedation with any surgeries or procedures Patient denies ever being told they had issues or difficulty with intubation  No FH of Malignant Hyperthermia Pt is not on diet pills Pt is not on  home 02  Pt is not on blood thinners  No A fib or A flutter Have any cardiac testing pending-- no  LOA: independent    Patient's chart reviewed by Rogena Class CNRA prior to previsit and patient appropriate for the [facility].  Previsit completed and red dot placed by patient's name on their procedure day (on provider's schedule).     PV completed with patient. Prep instructions sent via mychart and home address.

## 2024-03-15 ENCOUNTER — Encounter: Payer: Self-pay | Admitting: Internal Medicine

## 2024-03-15 ENCOUNTER — Other Ambulatory Visit: Payer: Self-pay | Admitting: Internal Medicine

## 2024-03-15 MED ORDER — PANTOPRAZOLE SODIUM 40 MG PO TBEC: 40.0000 mg | DELAYED_RELEASE_TABLET | Freq: Two times a day (BID) | ORAL | 0 refills | Status: AC

## 2024-03-20 ENCOUNTER — Encounter: Payer: Self-pay | Admitting: Family Medicine

## 2024-03-21 ENCOUNTER — Other Ambulatory Visit: Payer: Self-pay

## 2024-03-21 MED ORDER — VITAMIN D (ERGOCALCIFEROL) 1.25 MG (50000 UNIT) PO CAPS: 50000.0000 [IU] | ORAL_CAPSULE | ORAL | 0 refills | Status: DC

## 2024-03-23 ENCOUNTER — Encounter: Admitting: Internal Medicine

## 2024-04-13 ENCOUNTER — Other Ambulatory Visit: Payer: Self-pay

## 2024-04-13 ENCOUNTER — Other Ambulatory Visit (INDEPENDENT_AMBULATORY_CARE_PROVIDER_SITE_OTHER)

## 2024-04-13 ENCOUNTER — Other Ambulatory Visit: Payer: Self-pay | Admitting: Family Medicine

## 2024-04-13 ENCOUNTER — Ambulatory Visit: Payer: Self-pay | Admitting: Family Medicine

## 2024-04-13 DIAGNOSIS — R5383 Other fatigue: Secondary | ICD-10-CM

## 2024-04-13 LAB — CBC WITH DIFFERENTIAL/PLATELET
Basophils Absolute: 0 K/uL (ref 0.0–0.1)
Basophils Relative: 0.5 % (ref 0.0–3.0)
Eosinophils Absolute: 0.1 K/uL (ref 0.0–0.7)
Eosinophils Relative: 1.7 % (ref 0.0–5.0)
HCT: 36.7 % (ref 36.0–46.0)
Hemoglobin: 12.6 g/dL (ref 12.0–15.0)
Lymphocytes Relative: 27.3 % (ref 12.0–46.0)
Lymphs Abs: 1.8 K/uL (ref 0.7–4.0)
MCHC: 34.3 g/dL (ref 30.0–36.0)
MCV: 83.3 fl (ref 78.0–100.0)
Monocytes Absolute: 0.3 K/uL (ref 0.1–1.0)
Monocytes Relative: 4.7 % (ref 3.0–12.0)
Neutro Abs: 4.4 K/uL (ref 1.4–7.7)
Neutrophils Relative %: 65.8 % (ref 43.0–77.0)
Platelets: 276 K/uL (ref 150.0–400.0)
RBC: 4.41 Mil/uL (ref 3.87–5.11)
RDW: 13.2 % (ref 11.5–15.5)
WBC: 6.7 K/uL (ref 4.0–10.5)

## 2024-04-13 LAB — IBC PANEL
Iron: 92 ug/dL (ref 42–145)
Saturation Ratios: 28.3 % (ref 20.0–50.0)
TIBC: 324.8 ug/dL (ref 250.0–450.0)
Transferrin: 232 mg/dL (ref 212.0–360.0)

## 2024-04-13 LAB — COMPREHENSIVE METABOLIC PANEL WITH GFR
ALT: 15 U/L (ref 0–35)
AST: 16 U/L (ref 0–37)
Albumin: 4.5 g/dL (ref 3.5–5.2)
Alkaline Phosphatase: 36 U/L — ABNORMAL LOW (ref 39–117)
BUN: 16 mg/dL (ref 6–23)
CO2: 29 meq/L (ref 19–32)
Calcium: 9.4 mg/dL (ref 8.4–10.5)
Chloride: 102 meq/L (ref 96–112)
Creatinine, Ser: 0.66 mg/dL (ref 0.40–1.20)
GFR: 111.74 mL/min (ref >60.00)
Glucose, Bld: 79 mg/dL (ref 70–99)
Potassium: 4.1 meq/L (ref 3.5–5.1)
Sodium: 137 meq/L (ref 135–145)
Total Bilirubin: 0.6 mg/dL (ref 0.2–1.2)
Total Protein: 7 g/dL (ref 6.0–8.3)

## 2024-04-13 LAB — FERRITIN: Ferritin: 41.2 ng/mL (ref 10.0–291.0)

## 2024-04-13 LAB — VITAMIN D 25 HYDROXY (VIT D DEFICIENCY, FRACTURES): VITD: 56.75 ng/mL (ref 30.00–100.00)

## 2024-04-13 LAB — VITAMIN B12: Vitamin B-12: 487 pg/mL (ref 211–911)

## 2024-04-14 ENCOUNTER — Other Ambulatory Visit: Payer: Self-pay

## 2024-04-14 DIAGNOSIS — R5383 Other fatigue: Secondary | ICD-10-CM

## 2024-04-15 ENCOUNTER — Encounter: Payer: Self-pay | Admitting: Internal Medicine

## 2024-04-15 LAB — PTH, INTACT AND CALCIUM
Calcium: 9.3 mg/dL (ref 8.6–10.2)
PTH: 13 pg/mL — ABNORMAL LOW (ref 16–77)

## 2024-04-18 ENCOUNTER — Other Ambulatory Visit

## 2024-04-18 DIAGNOSIS — R5383 Other fatigue: Secondary | ICD-10-CM | POA: Diagnosis not present

## 2024-04-18 LAB — HEPATIC FUNCTION PANEL
ALT: 12 U/L (ref 0–35)
AST: 17 U/L (ref 0–37)
Albumin: 4.4 g/dL (ref 3.5–5.2)
Alkaline Phosphatase: 36 U/L — ABNORMAL LOW (ref 39–117)
Bilirubin, Direct: 0.1 mg/dL (ref 0.0–0.3)
Total Bilirubin: 0.6 mg/dL (ref 0.2–1.2)
Total Protein: 6.8 g/dL (ref 6.0–8.3)

## 2024-04-19 LAB — HEPATITIS PANEL, ACUTE
Hep A IgM: NONREACTIVE
Hep B C IgM: NONREACTIVE
Hepatitis B Surface Ag: NONREACTIVE
Hepatitis C Ab: NONREACTIVE

## 2024-04-19 LAB — CERULOPLASMIN: Ceruloplasmin: 22 mg/dL (ref 14–48)

## 2024-04-21 ENCOUNTER — Ambulatory Visit: Payer: Self-pay | Admitting: Family Medicine

## 2024-04-21 NOTE — Progress Notes (Signed)
 [name] [address] [place] [address] Phone: [phone] Subjective:   LILLETTE Claretha Schimke am a scribe for Dr. Claudene.   I'm seeing this patient by the request  of:  Jolinda Norene HERO, DO  CC: Fatigue follow-up  YEP:Dlagzrupcz  02/23/2024 Still feel most likely is secondary to heartburn and gastroesophageal reflux disease.  They did discuss the possibility of repeating another EGD.  Patient wants to hold on that and will try to do less caffeine which seems to be one of her main triggers.  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  I did not save the pictures or on the ultrasound today but were not still there.  Patient has been a little compliant on some of the supplementation we will try to do that on a more regular basis and then recheck labs again in 6 weeks.  Depending on how patient responds then we will talk about such things as transfusions.  Follow-up with me again in 2 months.     Updated 04/26/2024 Lafonda Sifuentes is a 38 y.o. female coming in with complaint of fatigue. Patient states that she is feeling pretty good today. Fatigue has faded for the most part.        Past Medical History:  Diagnosis Date   Iron deficiency anemia    Mild acne    Mononucleosis 05/2009   Rectocele affecting pregnancy    noted in prenatal but pt wants it on her EPIC chart that she has this problem   Reflux esophagitis    Scoliosis    Past Surgical History:  Procedure Laterality Date   ESOPHAGOGASTRODUODENOSCOPY  08/2023   MYRINGOTOMY     TONSILLECTOMY     WISDOM TOOTH EXTRACTION     Social History   Socioeconomic History   Marital status: Married    Spouse name: [name]   Number of children: 2   Years of education: Not on file   Highest education level: Not on file  Occupational History   Occupation: hairdresser   Occupation: [employer]  Tobacco Use   Smoking status: Never   Smokeless tobacco: Never  Vaping Use   Vaping  status: Never Used  Substance and Sexual Activity   Alcohol use: No    Alcohol/week: 0.0 standard drinks of alcohol   Drug use: No   Sexual activity: Not Currently    Comment: spouse vasectomy  Other Topics Concern   Not on file  Social History Narrative   Exercises regularly. Eats whole foods.     No ETOH, Tobacco or drug use   Christian faith. Ok with blood products if needed.   Married to [place]. 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[place]   Social Drivers of Corporate Investment Banker Strain: Not on [employer] Insecurity: Not on file  Transportation Needs: Not on file  Physical Activity: Not on file  Stress: Not on file  Social Connections: Not on file   Allergies  Allergen Reactions   Cephalexin  Swelling    Throat swelling   Penicillins Swelling    Swelling of eyes and rash when she took it with mono  Thinks she may have had throat swelling   Family History  Problem Relation Age of Onset   Hypertension Mother    Thyroid  disease Mother        hyperthyroid   Alcohol abuse Maternal  Uncle    Hyperlipidemia Maternal Grandmother    Diabetes Maternal Grandmother    Breast cancer Maternal Grandmother 68   Congestive Heart Failure Maternal Grandmother    Alcohol abuse Maternal Grandfather    Hyperlipidemia Maternal Grandfather    Emphysema Paternal Grandmother    Alcohol abuse Paternal Grandmother    Alzheimer's disease Paternal Grandfather    Alcohol abuse Paternal Grandfather    Alcohol abuse Half-Brother    Miscarriages / Stillbirths Neg Hx    Colon cancer Neg Hx    Colon polyps Neg Hx    Esophageal cancer Neg Hx    Rectal cancer Neg Hx    Stomach cancer Neg Hx         Current Outpatient Medications (Hematological):    ferrous sulfate 325 (65 FE) MG EC tablet, Take 325 mg by mouth daily with breakfast.  Current Outpatient Medications (Other):    Multiple Vitamin (MULTIVITAMIN ADULT PO), Take by  mouth.   pantoprazole  (PROTONIX ) 40 MG tablet, Take 1 tablet (40 mg total) by mouth 2 (two) times daily. TAKE 1 TABLET(40 MG) BY MOUTH TWICE DAILY   Vitamin D , Ergocalciferol , (DRISDOL ) 1.25 MG (50000 UNIT) CAPS capsule, Take 1 capsule (50,000 Units total) by mouth every 7 (seven) days.   Reviewed prior external information including notes and imaging from  primary care provider As well as notes that were available from care everywhere and other healthcare systems.  Past medical history, social, surgical and family history all reviewed in electronic medical record.  No pertanent information unless stated regarding to the chief complaint.   Review of Systems:  No headache, visual changes, nausea, vomiting, diarrhea, constipation, dizziness, abdominal pain, skin rash, fevers, chills, night sweats, weight loss, swollen lymph nodes, body aches, joint swelling, chest pain, shortness of breath, mood changes. POSITIVE muscle aches doing relatively well.  Objective  Blood pressure 110/60, pulse 86, height 5' 4 (1.626 m), weight 132 lb (59.9 kg), SpO2 98%.   General: No apparent distress alert and oriented x3 mood and affect normal, more rosy cheeks dressed appropriately.  HEENT: Pupils equal, extraocular movements intact  Respiratory: Patient's speak in full sentences and does not appear short of breath  Cardiovascular: No lower extremity edema, non tender, no erythema  Patient is sitting relatively comfortably. Nontender on exam today.   Impression and Recommendations:    The above documentation has been reviewed and is accurate and complete Issak Goley M Jahad Old, DO

## 2024-04-26 ENCOUNTER — Ambulatory Visit: Admitting: Family Medicine

## 2024-04-26 VITALS — BP 110/60 | HR 86 | Ht 64.0 in | Wt 132.0 lb

## 2024-04-26 DIAGNOSIS — D509 Iron deficiency anemia, unspecified: Secondary | ICD-10-CM | POA: Diagnosis not present

## 2024-04-26 DIAGNOSIS — D508 Other iron deficiency anemias: Secondary | ICD-10-CM

## 2024-04-26 DIAGNOSIS — R0789 Other chest pain: Secondary | ICD-10-CM | POA: Diagnosis not present

## 2024-04-26 DIAGNOSIS — E611 Iron deficiency: Secondary | ICD-10-CM

## 2024-04-26 NOTE — Patient Instructions (Addendum)
 Good to see you. Future labs prior to next appointment. (Cbc cmet ferritin iron)  Glad that you are feeling better. See me again in 3 months.

## 2024-04-26 NOTE — Assessment & Plan Note (Signed)
 Stable overall, we discussed icing regimen and home exercises, discussed which activities to do which ones to avoid.  Increase activity slowly.  Follow-up again in 6 to 12 weeks

## 2024-04-26 NOTE — Assessment & Plan Note (Signed)
 Discussed with patient doing much better at this moment. We discussed with patient continuing the supplementation and recheck in 3 months to make sure the iron hold steady.  Follow-up in 3 months and we will consider the possibility of further evaluation of the sternum again with ultrasound but patient is nearly asymptomatic at this time.  Follow-up with me again in 3 months

## 2024-05-13 ENCOUNTER — Ambulatory Visit: Admitting: Internal Medicine

## 2024-06-15 ENCOUNTER — Encounter: Payer: Self-pay | Admitting: Family Medicine

## 2024-06-15 ENCOUNTER — Other Ambulatory Visit: Payer: Self-pay

## 2024-06-15 ENCOUNTER — Ambulatory Visit (INDEPENDENT_AMBULATORY_CARE_PROVIDER_SITE_OTHER): Admitting: Internal Medicine

## 2024-06-15 ENCOUNTER — Encounter: Payer: Self-pay | Admitting: Internal Medicine

## 2024-06-15 VITALS — BP 112/72 | HR 87 | Ht 64.0 in | Wt 138.0 lb

## 2024-06-15 DIAGNOSIS — R0789 Other chest pain: Secondary | ICD-10-CM

## 2024-06-15 DIAGNOSIS — K2100 Gastro-esophageal reflux disease with esophagitis, without bleeding: Secondary | ICD-10-CM

## 2024-06-15 DIAGNOSIS — K760 Fatty (change of) liver, not elsewhere classified: Secondary | ICD-10-CM

## 2024-06-15 DIAGNOSIS — D509 Iron deficiency anemia, unspecified: Secondary | ICD-10-CM | POA: Diagnosis not present

## 2024-06-15 MED ORDER — VITAMIN D (ERGOCALCIFEROL) 1.25 MG (50000 UNIT) PO CAPS: 50000.0000 [IU] | ORAL_CAPSULE | ORAL | 0 refills | Status: AC

## 2024-06-15 NOTE — Progress Notes (Signed)
 "       Lindsey Clements 39 y.o. [birth year] [id number]  Assessment & Plan:   Encounter Diagnoses  Name Primary?   Gastroesophageal reflux disease with esophagitis without hemorrhage Yes   Iron deficiency anemia, unspecified iron deficiency anemia type    Atypical chest pain    Hepatic steatosis       GERD well-controlled without acid suppression, occasional mild symptoms managed with antacids, low risk for complications. - Continue avoiding dietary triggers, including soda and excessive alcohol. - Occasional use of calcium carbonate (Tums) for breakthrough symptoms acceptable. - Coffee and unsweetened tea may be tested for tolerance. - Rare alcohol consumption unlikely to cause esophageal damage; consume with food. - No daily acid suppression therapy needed.  I think iron deficiency was menstrual in origin, she was anemic at and after her last childbirth and has recovered since then.  I do not think additional endoscopic workup is needed.  She had an EGD with grade B esophagitis in 2025.   Mild hepatic steatosis on 2024 CT with normal liver enzymes, no progression or hepatomegaly, metabolic factors monitored. - Encouraged healthy dietary habits and reduced soda intake. - No additional liver-specific interventions required.  Fibrosis 4 Score = .68 (Low risk)        Interpretation for patients with NAFLD          <1.30       -  F0-F1 (Low risk)          1.30-2.67 -  Indeterminate           >2.67      -  F3-F4 (High risk)     Validated for ages 11-65      Return as needed  Subjective:   Chief Complaint: Follow-up of GERD  HPI Discussed the use of AI scribe software for clinical note transcription with the patient, who gave verbal consent to proceed.  History of Present Illness   Lindsey Clements is a 39 year old female with gastroesophageal reflux disease, hiatal hernia, fatty liver, and iron deficiency anemia who presents for follow-up of gastrointestinal symptoms and medication  management.  Gastroesophageal Reflux Symptoms and Hiatal Hernia: - Longstanding heartburn and reflux previously managed with pantoprazole , now discontinued - Occasional use of Tums for symptom relief - No current reflux symptoms - Intermittent substernal pressure associated with a known 1 cm hiatal hernia - No significant recent reflux symptoms - One episode of heartburn after consuming whiskey with Dr. Nunzio, relieved by Tums - Diet includes coffee and caffeine; working to eliminate soda and improve dietary habits - No worsening of symptoms with dietary indiscretions - Rare alcohol use - Prior endoscopy showed inflammation at the distal esophagus without permanent damage - Symptom improvement since stopping pantoprazole  and making dietary changes  Hepatic Steatosis: - History of hepatic steatosis - CT scan two years ago did not show liver enlargement   Iron Deficiency Anemia: - Iron deficiency anemia identified around the time of last childbirth in summer 2023 - Currently taking iron and vitamin D  supplements for deficiencies - Continues to menstruate with non-heavy periods - No lower gastrointestinal symptoms, changes in bowel habits, or rectal bleeding - Regular daily bowel movements with occasional narrow stools attributed to dietary changes - No persistent changes in stool caliber  Headache Symptoms: - Migraines begin in the neck      Allergies[1] Active Medications[2] Past Medical History:  Diagnosis Date   Iron deficiency anemia    Mild acne    Mononucleosis 05/2009  Rectocele affecting pregnancy    noted in prenatal but pt wants it on her EPIC chart that she has this problem   Reflux esophagitis    Scoliosis    Past Surgical History:  Procedure Laterality Date   ESOPHAGOGASTRODUODENOSCOPY  08/2023   MYRINGOTOMY     TONSILLECTOMY     WISDOM TOOTH EXTRACTION     Social History   Social History Narrative   Exercises regularly. Eats whole foods.     No  ETOH, Tobacco or drug use   Christian faith. Ok with blood products if needed.   Married to [place]. Has 2 children, a daughter [name] and a son [name]   Works as a social worker   Lives in [place]   family history includes Alcohol abuse in her half-brother, maternal grandfather, maternal uncle, paternal grandfather, and paternal grandmother; Alzheimer's disease in her paternal grandfather; Breast cancer (age of onset: 46) in her maternal grandmother; Congestive Heart Failure in her maternal grandmother; Diabetes in her maternal grandmother; Emphysema in her paternal grandmother; Hyperlipidemia in her maternal grandfather and maternal grandmother; Hypertension in her mother; Thyroid  disease in her mother.   Review of Systems As above  Objective:   Physical Exam BP 112/72   Pulse 87   Ht 5' 4 (1.626 m)   Wt 138 lb (62.6 kg)   LMP 06/08/2024   SpO2 99%   BMI 23.69 kg/m      [1]  Allergies Allergen Reactions   Cephalexin  Swelling    Throat swelling   Penicillins Swelling    Swelling of eyes and rash when she took it with mono  Thinks she may have had throat swelling  [2]  Current Meds  Medication Sig   ferrous sulfate 325 (65 FE) MG EC tablet Take 325 mg by mouth daily with breakfast.   Multiple Vitamin (MULTIVITAMIN ADULT PO) Take by mouth.   Vitamin D , Ergocalciferol , (DRISDOL ) 1.25 MG (50000 UNIT) CAPS capsule Take 1 capsule (50,000 Units total) by mouth every 7 (seven) days.   "

## 2024-06-15 NOTE — Patient Instructions (Signed)
 Glad things are better.  Keep doing what you are - use Tums as needed.  Get off soda!  I appreciate the opportunity to care for you. Lupita CHARLENA Commander, MD, NOLIA

## 2024-06-30 ENCOUNTER — Encounter: Payer: Self-pay | Admitting: Family Medicine

## 2024-07-01 ENCOUNTER — Other Ambulatory Visit: Payer: Self-pay

## 2024-07-01 DIAGNOSIS — R5383 Other fatigue: Secondary | ICD-10-CM

## 2024-07-27 ENCOUNTER — Ambulatory Visit: Admitting: Family Medicine
